# Patient Record
Sex: Female | Born: 1980 | Race: White | Hispanic: No | Marital: Married | Smoking: Never smoker
Health system: Southern US, Community
[De-identification: ages and names within clinical notes are randomized; demographics above are authoritative.]

## PROBLEM LIST (undated history)

## (undated) ENCOUNTER — Inpatient Hospital Stay (HOSPITAL_COMMUNITY): Payer: Self-pay

## (undated) DIAGNOSIS — Z8619 Personal history of other infectious and parasitic diseases: Secondary | ICD-10-CM

## (undated) DIAGNOSIS — R87619 Unspecified abnormal cytological findings in specimens from cervix uteri: Secondary | ICD-10-CM

## (undated) DIAGNOSIS — F32A Depression, unspecified: Secondary | ICD-10-CM

## (undated) DIAGNOSIS — I499 Cardiac arrhythmia, unspecified: Secondary | ICD-10-CM

## (undated) DIAGNOSIS — T7840XA Allergy, unspecified, initial encounter: Secondary | ICD-10-CM

## (undated) DIAGNOSIS — IMO0002 Reserved for concepts with insufficient information to code with codable children: Secondary | ICD-10-CM

## (undated) HISTORY — DX: Cardiac arrhythmia, unspecified: I49.9

## (undated) HISTORY — DX: Personal history of other infectious and parasitic diseases: Z86.19

## (undated) HISTORY — PX: ABDOMINAL HYSTERECTOMY: SHX81

## (undated) HISTORY — DX: Allergy, unspecified, initial encounter: T78.40XA

## (undated) HISTORY — PX: LEEP: SHX91

## (undated) HISTORY — DX: Unspecified abnormal cytological findings in specimens from cervix uteri: R87.619

## (undated) HISTORY — DX: Depression, unspecified: F32.A

## (undated) HISTORY — DX: Reserved for concepts with insufficient information to code with codable children: IMO0002

---

## 2011-07-24 LAB — OB RESULTS CONSOLE GC/CHLAMYDIA
Chlamydia: NEGATIVE
Gonorrhea: NEGATIVE

## 2011-07-24 LAB — OB RESULTS CONSOLE HIV ANTIBODY (ROUTINE TESTING): HIV: NONREACTIVE

## 2011-07-24 LAB — OB RESULTS CONSOLE RUBELLA ANTIBODY, IGM: Rubella: IMMUNE

## 2011-07-24 LAB — OB RESULTS CONSOLE RPR: RPR: NONREACTIVE

## 2012-02-02 ENCOUNTER — Inpatient Hospital Stay (HOSPITAL_COMMUNITY): Admission: AD | Admit: 2012-02-02 | Payer: 59 | Source: Ambulatory Visit | Admitting: Obstetrics and Gynecology

## 2012-03-03 ENCOUNTER — Telehealth (HOSPITAL_COMMUNITY): Payer: Self-pay | Admitting: *Deleted

## 2012-03-03 ENCOUNTER — Encounter (HOSPITAL_COMMUNITY): Payer: Self-pay | Admitting: *Deleted

## 2012-03-03 NOTE — Telephone Encounter (Signed)
Preadmission screen  

## 2012-03-04 ENCOUNTER — Inpatient Hospital Stay (HOSPITAL_COMMUNITY)
Admission: RE | Admit: 2012-03-04 | Discharge: 2012-03-07 | DRG: 775 | Disposition: A | Discharge status: Home / Self Care | Payer: 59 | Source: Ambulatory Visit | Attending: Obstetrics and Gynecology | Admitting: Obstetrics and Gynecology

## 2012-03-04 ENCOUNTER — Encounter (HOSPITAL_COMMUNITY): Payer: Self-pay

## 2012-03-04 DIAGNOSIS — O480 Post-term pregnancy: Principal | ICD-10-CM | POA: Diagnosis present

## 2012-03-04 LAB — CBC
HCT: 35.7 % — ABNORMAL LOW (ref 36.0–46.0)
MCH: 30.3 pg (ref 26.0–34.0)
MCV: 88.6 fL (ref 78.0–100.0)
RBC: 4.03 MIL/uL (ref 3.87–5.11)
WBC: 8.1 10*3/uL (ref 4.0–10.5)

## 2012-03-04 MED ORDER — OXYCODONE-ACETAMINOPHEN 5-325 MG PO TABS: 1.0000 | ORAL_TABLET | ORAL | Status: DC | PRN

## 2012-03-04 MED ORDER — FLEET ENEMA 7-19 GM/118ML RE ENEM: 1.0000 | ENEMA | RECTAL | Status: DC | PRN

## 2012-03-04 MED ORDER — LIDOCAINE HCL (PF) 1 % IJ SOLN
30.0000 mL | INTRAMUSCULAR | Status: DC | PRN
  Filled 2012-03-04: qty 30

## 2012-03-04 MED ORDER — OXYTOCIN BOLUS FROM INFUSION
250.0000 mL | Freq: Once | INTRAVENOUS | Status: AC
  Administered 2012-03-05: 250 mL via INTRAVENOUS
  Filled 2012-03-04: qty 500

## 2012-03-04 MED ORDER — TERBUTALINE SULFATE 1 MG/ML IJ SOLN: 0.2500 mg | Freq: Once | INTRAMUSCULAR | Status: AC | PRN

## 2012-03-04 MED ORDER — CITRIC ACID-SODIUM CITRATE 334-500 MG/5ML PO SOLN: 30.0000 mL | ORAL | Status: DC | PRN

## 2012-03-04 MED ORDER — LACTATED RINGERS IV SOLN
INTRAVENOUS | Status: DC
  Administered 2012-03-04: 20:00:00 via INTRAVENOUS
  Administered 2012-03-05: 125 mL via INTRAVENOUS
  Administered 2012-03-05: 05:00:00 via INTRAVENOUS

## 2012-03-04 MED ORDER — LACTATED RINGERS IV SOLN: 500.0000 mL | INTRAVENOUS | Status: DC | PRN

## 2012-03-04 MED ORDER — MISOPROSTOL 25 MCG QUARTER TABLET
25.0000 ug | ORAL_TABLET | ORAL | Status: DC | PRN
  Administered 2012-03-04: 25 ug via VAGINAL
  Filled 2012-03-04: qty 0.25

## 2012-03-04 MED ORDER — BUTORPHANOL TARTRATE 1 MG/ML IJ SOLN: 1.0000 mg | INTRAMUSCULAR | Status: DC | PRN

## 2012-03-04 MED ORDER — ONDANSETRON HCL 4 MG/2ML IJ SOLN
4.0000 mg | Freq: Four times a day (QID) | INTRAMUSCULAR | Status: DC | PRN
  Administered 2012-03-05: 4 mg via INTRAVENOUS
  Filled 2012-03-04: qty 2

## 2012-03-04 MED ORDER — OXYTOCIN 40 UNITS IN LACTATED RINGERS INFUSION - SIMPLE MED: 62.5000 mL/h | Freq: Once | INTRAVENOUS | Status: DC

## 2012-03-04 MED ORDER — ACETAMINOPHEN 325 MG PO TABS: 650.0000 mg | ORAL_TABLET | ORAL | Status: DC | PRN

## 2012-03-04 NOTE — H&P (Signed)
Caitlin Hunter is a 31 y.o. female presenting for post dates induction of labor. No ROM/CNS change/epigastric pain. Maternal Medical History:  Fetal activity: Perceived fetal activity is normal.      OB History    Grav Para Term Preterm Abortions TAB SAB Ect Mult Living   1              Past Medical History  Diagnosis Date  . H/O varicella   . Dysrhythmia     hx bradycardia   Past Surgical History  Procedure Date  . Leep    Family History: family history includes Cancer in her mother, paternal aunt, and paternal grandfather; Depression in her maternal aunt, maternal grandmother, and maternal uncle; Diabetes in her father; Kidney disease in her father; Mental retardation in her cousin; and Multiple sclerosis in her mother. Social History:  reports that she has never smoked. She has never used smokeless tobacco. She reports that she does not drink alcohol or use illicit drugs.   Prenatal Transfer Tool  Maternal Diabetes: No Genetic Screening: Normal Maternal Ultrasounds/Referrals: Normal Fetal Ultrasounds or other Referrals:  None Maternal Substance Abuse:  No Significant Maternal Medications:  None Significant Maternal Lab Results:  None Other Comments:  None  Review of Systems  Eyes: Negative for blurred vision.  Gastrointestinal: Negative for abdominal pain.  Neurological: Negative for headaches.      Blood pressure 128/76, pulse 57, temperature 98.1 F (36.7 C), temperature source Oral, resp. rate 18, height 5\' 6"  (1.676 m), weight 90.719 kg (200 lb), last menstrual period 05/22/2011. Maternal Exam:  Uterine Assessment: Contraction strength is mild.  Contraction frequency is irregular.   Abdomen: Patient reports no abdominal tenderness. Fetal presentation: vertex     Fetal Exam Fetal Monitor Review: Pattern: accelerations present.       Physical Exam  Cardiovascular: Normal rate and regular rhythm.   Respiratory: Effort normal and breath sounds  normal.  GI: Soft. Bowel sounds are normal.  Neurological: She has normal reflexes.    Prenatal labs: ABO, Rh: A/Positive/-- (01/03 0000) Antibody: Negative (01/03 0000) Rubella: Immune (01/03 0000) RPR: Nonreactive (01/03 0000)  HBsAg: Negative (01/03 0000)  HIV: Non-reactive (01/03 0000)  GBS: Negative (07/12 0000)   Assessment/Plan: 31 yo G1P0 at 41` weeks for two stage induction of labor.  D/W patient and husband induction and risks-fetal distress, emergency cesarean section, tetanic contractions, failed induction. All questions answered.   Lothar Prehn II,Ronnika Collett E 03/04/2012, 8:47 PM

## 2012-03-05 ENCOUNTER — Inpatient Hospital Stay (HOSPITAL_COMMUNITY): Payer: 59 | Admitting: Anesthesiology

## 2012-03-05 ENCOUNTER — Encounter (HOSPITAL_COMMUNITY): Payer: Self-pay

## 2012-03-05 ENCOUNTER — Encounter (HOSPITAL_COMMUNITY): Payer: Self-pay | Admitting: Anesthesiology

## 2012-03-05 MED ORDER — PHENYLEPHRINE 40 MCG/ML (10ML) SYRINGE FOR IV PUSH (FOR BLOOD PRESSURE SUPPORT): 80.0000 ug | PREFILLED_SYRINGE | INTRAVENOUS | Status: DC | PRN

## 2012-03-05 MED ORDER — FENTANYL 2.5 MCG/ML BUPIVACAINE 1/10 % EPIDURAL INFUSION (WH - ANES)
14.0000 mL/h | INTRAMUSCULAR | Status: DC
  Administered 2012-03-05 (×2): 14 mL/h via EPIDURAL
  Filled 2012-03-05 (×3): qty 60

## 2012-03-05 MED ORDER — FENTANYL 2.5 MCG/ML BUPIVACAINE 1/10 % EPIDURAL INFUSION (WH - ANES)
INTRAMUSCULAR | Status: DC | PRN
  Administered 2012-03-05: 14 mL/h via EPIDURAL

## 2012-03-05 MED ORDER — DIPHENHYDRAMINE HCL 50 MG/ML IJ SOLN: 12.5000 mg | INTRAMUSCULAR | Status: DC | PRN

## 2012-03-05 MED ORDER — EPHEDRINE 5 MG/ML INJ: 10.0000 mg | INTRAVENOUS | Status: DC | PRN

## 2012-03-05 MED ORDER — LIDOCAINE HCL (PF) 1 % IJ SOLN
INTRAMUSCULAR | Status: DC | PRN
  Administered 2012-03-05 (×2): 4 mL

## 2012-03-05 MED ORDER — OXYTOCIN 40 UNITS IN LACTATED RINGERS INFUSION - SIMPLE MED
1.0000 m[IU]/min | INTRAVENOUS | Status: DC
  Administered 2012-03-05: 2 m[IU]/min via INTRAVENOUS
  Filled 2012-03-05: qty 1000

## 2012-03-05 MED ORDER — LACTATED RINGERS IV SOLN
500.0000 mL | Freq: Once | INTRAVENOUS | Status: AC
  Administered 2012-03-05: 500 mL via INTRAVENOUS

## 2012-03-05 MED ORDER — PHENYLEPHRINE 40 MCG/ML (10ML) SYRINGE FOR IV PUSH (FOR BLOOD PRESSURE SUPPORT)
80.0000 ug | PREFILLED_SYRINGE | INTRAVENOUS | Status: DC | PRN
  Filled 2012-03-05: qty 5

## 2012-03-05 MED ORDER — TERBUTALINE SULFATE 1 MG/ML IJ SOLN: 0.2500 mg | Freq: Once | INTRAMUSCULAR | Status: AC | PRN

## 2012-03-05 MED ORDER — EPHEDRINE 5 MG/ML INJ
10.0000 mg | INTRAVENOUS | Status: DC | PRN
  Filled 2012-03-05: qty 4

## 2012-03-05 NOTE — Anesthesia Preprocedure Evaluation (Signed)
Anesthesia Evaluation  Patient identified by MRN, date of birth, ID band Patient awake    Reviewed: Allergy & Precautions, H&P , Patient's Chart, lab work & pertinent test results  Airway Mallampati: III TM Distance: >3 FB Neck ROM: full    Dental No notable dental hx. (+) Teeth Intact   Pulmonary neg pulmonary ROS,  breath sounds clear to auscultation  Pulmonary exam normal       Cardiovascular + dysrhythmias Rhythm:regular Rate:Normal     Neuro/Psych negative neurological ROS  negative psych ROS   GI/Hepatic negative GI ROS, Neg liver ROS,   Endo/Other  negative endocrine ROS  Renal/GU negative Renal ROS  negative genitourinary   Musculoskeletal   Abdominal Normal abdominal exam  (+)   Peds  Hematology negative hematology ROS (+)   Anesthesia Other Findings   Reproductive/Obstetrics (+) Pregnancy                           Anesthesia Physical Anesthesia Plan  ASA: II  Anesthesia Plan: Epidural   Post-op Pain Management:    Induction:   Airway Management Planned:   Additional Equipment:   Intra-op Plan:   Post-operative Plan:   Informed Consent: I have reviewed the patients History and Physical, chart, labs and discussed the procedure including the risks, benefits and alternatives for the proposed anesthesia with the patient or authorized representative who has indicated his/her understanding and acceptance.     Plan Discussed with: Anesthesiologist  Anesthesia Plan Comments:         Anesthesia Quick Evaluation

## 2012-03-05 NOTE — Progress Notes (Signed)
Pt complete at 8pm but not feeling pressure.  Plan was to labor down  FHT reassuring Toco Q2 Cvx c/c/+2 per RN exam at 8pm  A/P:  Will start pushing now.

## 2012-03-05 NOTE — Progress Notes (Signed)
Pt comfortable w/ epidural  FHT reassuring w/ accels Toco Q5-6 Cvx 4/90/-1, edematous  A/P:  Discussed pitocin augmentation.  Pt will consider

## 2012-03-05 NOTE — Progress Notes (Signed)
Pt resting comfortably    FHT reassuring Toco Q6-8 Cvx unchanged, 4cm  A/P:  Rec to augment w/ picocin, pt and husband agree IUPC placed

## 2012-03-05 NOTE — Progress Notes (Signed)
Pt getting uncomfortable w/ contractions.  No lof or vb.  S/p cytotec x 1 overnight  FHT reassuring Toco Q8 Cvx 3/90/-2 AROM - scant fluid/bloody show EFW 9#  A/P:  Will recheck in 2 hrs to eval need to give pitocin Exp mngt

## 2012-03-05 NOTE — Anesthesia Procedure Notes (Signed)
Epidural Patient location during procedure: OB Start time: 03/05/2012 1:24 PM  Staffing Anesthesiologist: Staton Markey A. Performed by: anesthesiologist   Preanesthetic Checklist Completed: patient identified, site marked, surgical consent, pre-op evaluation, timeout performed, IV checked, risks and benefits discussed and monitors and equipment checked  Epidural Patient position: sitting Prep: site prepped and draped and DuraPrep Patient monitoring: continuous pulse ox and blood pressure Approach: midline Injection technique: LOR air  Needle:  Needle type: Tuohy  Needle gauge: 17 G Needle length: 9 cm Needle insertion depth: 6 cm Catheter type: closed end flexible Catheter size: 19 Gauge Catheter at skin depth: 11 cm Test dose: negative and Other  Assessment Events: blood not aspirated, injection not painful, no injection resistance, negative IV test and no paresthesia  Additional Notes Patient identified. Risks and benefits discussed including failed block, incomplete  Pain control, post dural puncture headache, nerve damage, paralysis, blood pressure Changes, nausea, vomiting, reactions to medications-both toxic and allergic and post Partum back pain. All questions were answered. Patient expressed understanding and wished to proceed. Sterile technique was used throughout procedure. Epidural site was Dressed with sterile barrier dressing. No paresthesias, signs of intravascular injection Or signs of intrathecal spread were encountered.  Patient was more comfortable after the epidural was dosed. Please see RN's note for documentation of vital signs and FHR which are stable.

## 2012-03-05 NOTE — Progress Notes (Addendum)
SVD of viable female infant w/ apgars of 7,9. PH 7.22 Mild shoulder dystocia resolved <10 sec with suprapubic pressure & Mcroberts Nuchal cord x 1 delivered over body Placenta delivered spontaneous w/ 3VC.   1st degree lac & periurethral lac repaired w/ 3-0 vicryl rapide.  Fundus firm.  EBL 400cc .

## 2012-03-06 ENCOUNTER — Encounter (HOSPITAL_COMMUNITY): Payer: Self-pay

## 2012-03-06 LAB — CBC
HCT: 35.2 % — ABNORMAL LOW (ref 36.0–46.0)
MCH: 30.6 pg (ref 26.0–34.0)
MCHC: 34.4 g/dL (ref 30.0–36.0)
MCV: 88.9 fL (ref 78.0–100.0)
Platelets: 188 10*3/uL (ref 150–400)
RDW: 12.7 % (ref 11.5–15.5)

## 2012-03-06 MED ORDER — MEASLES, MUMPS & RUBELLA VAC ~~LOC~~ INJ
0.5000 mL | INJECTION | Freq: Once | SUBCUTANEOUS | Status: DC
  Filled 2012-03-06: qty 0.5

## 2012-03-06 MED ORDER — WITCH HAZEL-GLYCERIN EX PADS: 1.0000 "application " | MEDICATED_PAD | CUTANEOUS | Status: DC | PRN

## 2012-03-06 MED ORDER — MEDROXYPROGESTERONE ACETATE 150 MG/ML IM SUSP: 150.0000 mg | INTRAMUSCULAR | Status: DC | PRN

## 2012-03-06 MED ORDER — TETANUS-DIPHTH-ACELL PERTUSSIS 5-2.5-18.5 LF-MCG/0.5 IM SUSP: 0.5000 mL | Freq: Once | INTRAMUSCULAR | Status: DC

## 2012-03-06 MED ORDER — PRENATAL MULTIVITAMIN CH
1.0000 | ORAL_TABLET | Freq: Every day | ORAL | Status: DC
  Administered 2012-03-06: 1 via ORAL
  Filled 2012-03-06: qty 1

## 2012-03-06 MED ORDER — ONDANSETRON HCL 4 MG PO TABS: 4.0000 mg | ORAL_TABLET | ORAL | Status: DC | PRN

## 2012-03-06 MED ORDER — IBUPROFEN 600 MG PO TABS
600.0000 mg | ORAL_TABLET | Freq: Four times a day (QID) | ORAL | Status: DC
  Administered 2012-03-06 – 2012-03-07 (×6): 600 mg via ORAL
  Filled 2012-03-06 (×5): qty 1

## 2012-03-06 MED ORDER — SENNOSIDES-DOCUSATE SODIUM 8.6-50 MG PO TABS
2.0000 | ORAL_TABLET | Freq: Every day | ORAL | Status: DC
  Administered 2012-03-06: 2 via ORAL

## 2012-03-06 MED ORDER — DIPHENHYDRAMINE HCL 25 MG PO CAPS: 25.0000 mg | ORAL_CAPSULE | Freq: Four times a day (QID) | ORAL | Status: DC | PRN

## 2012-03-06 MED ORDER — ONDANSETRON HCL 4 MG/2ML IJ SOLN: 4.0000 mg | INTRAMUSCULAR | Status: DC | PRN

## 2012-03-06 MED ORDER — LANOLIN HYDROUS EX OINT: TOPICAL_OINTMENT | CUTANEOUS | Status: DC | PRN

## 2012-03-06 MED ORDER — DIBUCAINE 1 % RE OINT: 1.0000 "application " | TOPICAL_OINTMENT | RECTAL | Status: DC | PRN

## 2012-03-06 MED ORDER — BENZOCAINE-MENTHOL 20-0.5 % EX AERO
1.0000 "application " | INHALATION_SPRAY | CUTANEOUS | Status: DC | PRN
  Filled 2012-03-06: qty 56

## 2012-03-06 MED ORDER — OXYCODONE-ACETAMINOPHEN 5-325 MG PO TABS: 1.0000 | ORAL_TABLET | ORAL | Status: DC | PRN

## 2012-03-06 MED ORDER — SIMETHICONE 80 MG PO CHEW: 80.0000 mg | CHEWABLE_TABLET | ORAL | Status: DC | PRN

## 2012-03-06 NOTE — Anesthesia Postprocedure Evaluation (Signed)
  Anesthesia Post-op Note  Patient: Caitlin Hunter  Procedure(s) Performed: * No procedures listed *  Patient Location: Mother/Baby  Anesthesia Type: Epidural  Level of Consciousness: awake, alert  and oriented  Airway and Oxygen Therapy: Patient Spontanous Breathing  Post-op Pain: mild  Post-op Assessment: Patient's Cardiovascular Status Stable, Respiratory Function Stable, Patent Airway, No signs of Nausea or vomiting and Pain level controlled  Post-op Vital Signs: stable  Complications: No apparent anesthesia complications

## 2012-03-06 NOTE — Progress Notes (Signed)
Post Partum Day 1 Subjective: no complaints, up ad lib and tolerating PO  Objective: Blood pressure 96/58, pulse 58, temperature 98.1 F (36.7 C), temperature source Oral, resp. rate 18, height 5\' 6"  (1.676 m), weight 90.719 kg (200 lb), last menstrual period 05/22/2011, SpO2 96.00%, unknown if currently breastfeeding.  Physical Exam:  General: alert and cooperative Lochia: appropriate Uterine Fundus: firm Incision: n/a DVT Evaluation: No evidence of DVT seen on physical exam.   Basename 03/06/12 0615 03/04/12 2010  HGB 12.1 12.2  HCT 35.2* 35.7*    Assessment/Plan: Plan for discharge tomorrow   LOS: 2 days   Tyneisha Hegeman 03/06/2012, 8:20 AM

## 2012-03-07 NOTE — Discharge Summary (Signed)
Obstetric Discharge Summary Reason for Admission: induction of labor Prenatal Procedures: ultrasound Intrapartum Procedures: spontaneous vaginal delivery Postpartum Procedures: none Complications-Operative and Postpartum: none Hemoglobin  Date Value Range Status  03/06/2012 12.1  12.0 - 15.0 g/dL Final     HCT  Date Value Range Status  03/06/2012 35.2* 36.0 - 46.0 % Final    Physical Exam:  General: alert and cooperative Lochia: appropriate Uterine Fundus: firm Incision: n/a DVT Evaluation: No evidence of DVT seen on physical exam.  Discharge Diagnoses: Term Pregnancy-delivered  Discharge Information: Date: 03/07/2012 Activity: pelvic rest Diet: routine Medications: PNV and Ibuprofen Condition: stable Instructions: refer to practice specific booklet Discharge to: home Follow-up Information    Schedule an appointment as soon as possible for a visit in 6 weeks to follow up.         Newborn Data: Live born female  Birth Weight: 9 lb 2.9 oz (4165 g) APGAR: 7, 9  Home with mother.  Kerim Statzer 03/07/2012, 8:05 AM

## 2012-03-11 ENCOUNTER — Ambulatory Visit (HOSPITAL_COMMUNITY)
Admission: RE | Admit: 2012-03-11 | Discharge: 2012-03-11 | Disposition: A | Discharge status: Home / Self Care | Payer: 59 | Source: Ambulatory Visit | Attending: Obstetrics and Gynecology | Admitting: Obstetrics and Gynecology

## 2012-12-21 LAB — OB RESULTS CONSOLE HEPATITIS B SURFACE ANTIGEN: Hepatitis B Surface Ag: NEGATIVE

## 2012-12-21 LAB — OB RESULTS CONSOLE GC/CHLAMYDIA
CHLAMYDIA, DNA PROBE: NEGATIVE
Gonorrhea: NEGATIVE

## 2012-12-21 LAB — OB RESULTS CONSOLE ANTIBODY SCREEN: ANTIBODY SCREEN: NEGATIVE

## 2012-12-21 LAB — OB RESULTS CONSOLE HIV ANTIBODY (ROUTINE TESTING): HIV: NONREACTIVE

## 2012-12-21 LAB — OB RESULTS CONSOLE ABO/RH: RH Type: POSITIVE

## 2012-12-21 LAB — OB RESULTS CONSOLE RPR: RPR: NONREACTIVE

## 2012-12-21 LAB — OB RESULTS CONSOLE RUBELLA ANTIBODY, IGM: Rubella: IMMUNE

## 2013-06-29 LAB — OB RESULTS CONSOLE GBS: STREP GROUP B AG: NEGATIVE

## 2013-07-21 NOTE — L&D Delivery Note (Signed)
Delivery Note At 12:44 PM a viable female was delivered via Vaginal, Spontaneous Delivery (Presentation: ; Occiput Anterior).  APGAR: , ; weight .   Placenta status: Intact, Spontaneous.  Cord: 3 vessels with the following complications: None.  Cord pH: not sent  Anesthesia: Epidural  Episiotomy: none Lacerations: lL periurethral Suture Repair: 3.0 vicryl rapide Est. Blood Loss (mL): 300  Mom to postpartum.  Baby to Nursery.  [name],Caitlin Hunter 08/01/2013, 12:56 PM

## 2013-07-22 ENCOUNTER — Encounter (HOSPITAL_COMMUNITY): Payer: Self-pay | Admitting: *Deleted

## 2013-07-22 ENCOUNTER — Inpatient Hospital Stay (HOSPITAL_COMMUNITY)
Admission: AD | Admit: 2013-07-22 | Discharge: 2013-07-22 | Disposition: A | Discharge status: Home / Self Care | Payer: 59 | Source: Ambulatory Visit | Attending: Obstetrics & Gynecology | Admitting: Obstetrics & Gynecology

## 2013-07-22 DIAGNOSIS — R1032 Left lower quadrant pain: Secondary | ICD-10-CM | POA: Insufficient documentation

## 2013-07-22 DIAGNOSIS — O479 False labor, unspecified: Secondary | ICD-10-CM | POA: Insufficient documentation

## 2013-07-22 NOTE — MAU Note (Signed)
Started having pain in LLQ, last for 1.5 hrs. Started to go away, but now is having contractions and back pan.

## 2013-07-22 NOTE — Discharge Instructions (Signed)

## 2013-07-28 ENCOUNTER — Telehealth (HOSPITAL_COMMUNITY): Payer: Self-pay | Admitting: *Deleted

## 2013-07-28 ENCOUNTER — Encounter (HOSPITAL_COMMUNITY): Payer: Self-pay | Admitting: *Deleted

## 2013-07-28 ENCOUNTER — Inpatient Hospital Stay (HOSPITAL_COMMUNITY)
Admission: AD | Admit: 2013-07-28 | Discharge: 2013-07-29 | Disposition: A | Discharge status: Home / Self Care | Payer: 59 | Source: Ambulatory Visit | Attending: Obstetrics and Gynecology | Admitting: Obstetrics and Gynecology

## 2013-07-28 DIAGNOSIS — O479 False labor, unspecified: Secondary | ICD-10-CM | POA: Insufficient documentation

## 2013-07-28 DIAGNOSIS — O4693 Antepartum hemorrhage, unspecified, third trimester: Secondary | ICD-10-CM

## 2013-07-28 DIAGNOSIS — O4690 Antepartum hemorrhage, unspecified, unspecified trimester: Secondary | ICD-10-CM | POA: Insufficient documentation

## 2013-07-28 LAB — CBC
HCT: 34.2 % — ABNORMAL LOW (ref 36.0–46.0)
Hemoglobin: 12 g/dL (ref 12.0–15.0)
MCH: 30.2 pg (ref 26.0–34.0)
MCHC: 35.1 g/dL (ref 30.0–36.0)
MCV: 86.1 fL (ref 78.0–100.0)
PLATELETS: 193 10*3/uL (ref 150–400)
RBC: 3.97 MIL/uL (ref 3.87–5.11)
RDW: 12.5 % (ref 11.5–15.5)
WBC: 9 10*3/uL (ref 4.0–10.5)

## 2013-07-28 MED ORDER — SODIUM CHLORIDE 0.9 % IV SOLN: INTRAVENOUS | Status: DC

## 2013-07-28 NOTE — MAU Note (Signed)
Pt reports that she had some leaking and thought that her water had broken. When she got out of bed she discovered that she was bleeding. Pt also reports having some clots.

## 2013-07-28 NOTE — Telephone Encounter (Signed)
Preadmission screen  

## 2013-07-28 NOTE — MAU Provider Note (Signed)
Chief Complaint:  Vaginal Bleeding   First Provider Initiated Contact with Patient 07/28/13 2335     HPI: Caitlin Hunter is a 33 y.o. G2P1001 at [redacted]w[redacted]d who presents to maternity admissions reporting waking up with heavy, bright red bleeding and passing moderate-sized clots. Reports that blood was running down her legs into her shoes. Mild contractions w/out pain in between. No prior Hx of bleeding this pregnancy.   Denies leakage of fluid. Good fetal movement.   Pregnancy Course: Low-lying placenta per 18 week anatomy scan, but resolved per office [place].  Past Medical History: Past Medical History  Diagnosis Date  . H/O varicella   . Dysrhythmia     hx bradycardia  . Abnormal Pap smear     Past obstetric history: OB History  Gravida Para Term Preterm AB SAB TAB Ectopic Multiple Living  2 1 1  0 0 0 0 0 0 1    # Outcome Date GA Lbr Len/2nd Weight Sex Delivery Anes PTL Lv  2 CUR           1 TRM 03/05/12 [redacted]w[redacted]d 08:30 / 02:45 4.165 kg (9 lb 2.9 oz) F SVD EPI  Y      Past Surgical History: Past Surgical History  Procedure Laterality Date  . Leep       Family History: Family History  Problem Relation Age of Onset  . Multiple sclerosis Mother   . Cancer Mother     ovarian  . Diabetes Father   . Kidney disease Father   . Depression Maternal Aunt   . Depression Maternal Uncle   . Cancer Paternal Aunt     lymphoma  . Depression Maternal Grandmother   . Cancer Paternal Grandfather     kidney  . Mental retardation Cousin     2nd cousin trisomy 26    Social History: History  Substance Use Topics  . Smoking status: Never Smoker   . Smokeless tobacco: Never Used  . Alcohol Use: No    Allergies:  Allergies  Allergen Reactions  . Aspirin Swelling and Rash    Meds:  Prescriptions prior to admission  Medication Sig Dispense Refill  . Prenatal Vit-Fe Fumarate-FA (PRENATAL MULTIVITAMIN) TABS Take 1 tablet by mouth daily.        ROS: Pertinent findings in history of  present illness. Neg for dizziness, syncope, HA, vision changes, epigastric pain.   Physical Exam  Blood pressure 106/63, pulse 63, temperature 98.4 F (36.9 C), temperature source Oral, resp. rate 16, height 5\' 6"  (1.676 m), weight 90.719 kg (200 lb), last menstrual period 10/23/2012. GENERAL: Well-developed, well-nourished female in no acute distress.  HEENT: normocephalic HEART: normal rate RESP: normal effort ABDOMEN: Soft, non-tender, gravid appropriate for gestational age EXTREMITIES: Nontender, no edema NEURO: alert and oriented SPECULUM EXAM: NEFG, moderate amount of bright red blood in vault. Neg pool. Cervix clean Dilation: 1.5 Effacement (%): Thick Station: -3 Exam by:: Ivonne Andrew CNM Dilation: 1.5 Effacement (%): Thick Station: -3 Exam by:: Ivonne Andrew CNM  FHT:  Baseline 130 , moderate variability, accelerations present, no decelerations Contractions: irreg, mild   Labs: Results for orders placed during the hospital encounter of 07/28/13 (from the past 24 hour(s))  TYPE AND SCREEN     Status: None   Collection Time    07/28/13 11:25 PM      Result Value Range   ABO/RH(D) A POS     Antibody Screen NEG     Sample Expiration 07/31/2013    CBC  Status: Abnormal   Collection Time    07/28/13 11:25 PM      Result Value Range   WBC 9.0  4.0 - 10.5 K/uL   RBC 3.97  3.87 - 5.11 MIL/uL   Hemoglobin 12.0  12.0 - 15.0 g/dL   HCT 16.134.2 (*) 09.636.0 - 04.546.0 %   MCV 86.1  78.0 - 100.0 fL   MCH 30.2  26.0 - 34.0 pg   MCHC 35.1  30.0 - 36.0 g/dL   RDW 40.912.5  81.111.5 - 91.415.5 %   Platelets 193  150 - 400 K/uL  ABO/RH     Status: None   Collection Time    07/28/13 11:25 PM      Result Value Range   ABO/RH(D) A POS     Imaging:  OB Limited: Cephalic. Posterior placenta. No evidence of abruption, but portions of placenta obscured by fetal parts   MAU Course: Small -moderate of bleeding during MAU visit.   Dr. Rana SnareLowe notified of bleeding episode, US, decreased bleeding. Obs on  Antenatal.   Assessment: 1. Vaginal bleeding in pregnancy, third trimester-concern for abruption.    Plan: Obs on Antenatal.  [name], CNM 07/29/2013 4:18 AM

## 2013-07-29 ENCOUNTER — Encounter (HOSPITAL_COMMUNITY): Payer: Self-pay

## 2013-07-29 ENCOUNTER — Inpatient Hospital Stay (HOSPITAL_COMMUNITY): Payer: 59

## 2013-07-29 DIAGNOSIS — O4690 Antepartum hemorrhage, unspecified, unspecified trimester: Secondary | ICD-10-CM

## 2013-07-29 LAB — TYPE AND SCREEN
ABO/RH(D): A POS
ANTIBODY SCREEN: NEGATIVE

## 2013-07-29 LAB — CBC
HCT: 31.9 % — ABNORMAL LOW (ref 36.0–46.0)
Hemoglobin: 10.9 g/dL — ABNORMAL LOW (ref 12.0–15.0)
MCH: 29.8 pg (ref 26.0–34.0)
MCHC: 34.2 g/dL (ref 30.0–36.0)
MCV: 87.2 fL (ref 78.0–100.0)
Platelets: 170 10*3/uL (ref 150–400)
RBC: 3.66 MIL/uL — AB (ref 3.87–5.11)
RDW: 12.6 % (ref 11.5–15.5)
WBC: 8.5 10*3/uL (ref 4.0–10.5)

## 2013-07-29 LAB — ABO/RH: ABO/RH(D): A POS

## 2013-07-29 MED ORDER — LACTATED RINGERS IV SOLN
INTRAVENOUS | Status: DC
  Administered 2013-07-28: 23:00:00 via INTRAVENOUS

## 2013-07-29 MED ORDER — LACTATED RINGERS IV SOLN
INTRAVENOUS | Status: DC
  Administered 2013-07-29: 05:00:00 via INTRAVENOUS

## 2013-07-29 NOTE — Discharge Summary (Signed)
Physician Discharge Summary  Patient ID: Elon SpannerLauren Hodge-Frace MRN: [id number] DOB/AGE: Dec 26, 1980 33 y.o.  Admit date: 07/28/2013 Discharge date: 07/29/2013  Admission Diagnoses: vaginal bleeding, pregnancy  Discharge Diagnoses:  Active Problems:   * No active hospital problems. *   Discharged Condition: stable  Hospital Course: Pt was admitted for overnight observation after an episode of vaginal bleeding.  Pt had digital cervical exam Wednesday in office, 2cm.  She was admitted to hospital overnight for observation after episode of vaginal bleeding.  Overnight, no further vb was noted.  Ctx are occasional but no cervical change noted.  Plan for d/c home with f/u as scheduled for indxn.  Consults: None  Significant Diagnostic Studies:    Treatments: IV hydration  Discharge Exam: Blood pressure 107/65, pulse 69, temperature 98.2 F (36.8 C), temperature source Oral, resp. rate 20, height 5\' 6"  (1.676 m), weight 90.719 kg (200 lb), last menstrual period 10/23/2012. Gen - NAD Abd - gravid, NT Cvx - unchanged, no blood on glove  Disposition: 01-Home or Self Care   DAVS: Troubleshooting Information   AVS troubleshooting information Refreshed 2013-07-29 08:43:06 EST   Data Value   Patient ID, internal format "  [id number]"   Patient DAT of contact being viewed [phone]   Patient CSN of contact being viewed [id number]   Mode OP_MEDS   Discharge IEV record [id number]       DAVS: Context IP   Data Value     Basic   LPG ID CHL IP DISCHARGE AVS Marylu LundSMARTLINK [[id number]][[phone]]   pData [phone]^Eprivate([id number])   Calling context ""   Filter LPP "" [[id number]][[phone]]   Filter code $$FilterPP^LMIUTIL1(ordId,"","1,2,3,4,5,6,7,8,9,10,11,12,13,14,1000,1001,1002,1003,1004,1005,1006,1008,1009,1010,1011,1012,1013,1014,1015")     Inpatient   IsAdmission Yes [1]   Discharge IEV [id number]   Current admission DAT [phone] [id number]     Emergency Department   IsED (contact) No [0]   IsCurED (patient) No [0]   PatientHasArrivedInED (only applies to ED contacts) Yes [1]     Hospital Outpatient   IsHOV No [0]   Appt status ""   ADT status Admitted   HOV as IP or AMB Treat as inpatient     Ambulatory   Is Ambulatory Contact No [0]   Is Encounter Closed No [0]       DAVS: Snapshot Live   Data Value   Showing a snapshot? No [0]   Snapshot strategy As of now [1]   IEV record [id number]   Most recent snapshot line for patient (in "OP_MEDS" mode) ""   Absolute last snapshot line (in "OP_MEDS" mode) ""       DAVS: Generating the med list   ID Order Details   [id number] Prenatal Vit-Fe Fumarate-FA (PRENATAL MULTIVITAMIN) TABS Found by the discharge search as a PTA order.       DAVS: Med list   Name In ORDERS node? In PTA_MEDS node?   Prenatal Vit-Fe Fumarate-FA (PRENATAL MULTIVITAMIN) TABS YES YES   (1 orders in ORDERS; 1 orders in PTA_MEDS)       DAVS: Pairing PTA orders with non-PTA orders (show by pair)   Name Name   Prenatal Vit-Fe Fumarate-FA (PRENATAL MULTIVITAMIN) TABS        DAVS: Pairing PTA orders with non-PTA orders (show by order)   Name In pair? PTA pairs Take Home pairs   Prenatal Vit-Fe Fumarate-FA (PRENATAL MULTIVITAMIN) TABS Yes 1    (1 orders)       DAVS: Calculating dispositions (first pass)   Action Details Action Details Disposition   Resume at Discharge [  152] lpp-allow      Key   Mnemonic Meaning   comb-from a STOP order that was combined with a START order   comb-to a START order that was combined with a STOP order   conc-enc from an encounter concurrent to this one   disc discontinued   dup-stop filtered out because it was a STOP order that duplicated another order   exp expired   fut-disc discontinued with a future discontinue date   lpp-allow the filter LPP decided not to hide this order   lpp-hide the filter LPP decided to hide this order   lpp-skip the order was not evaluated by the filter LPP because the order is part of a modification   LT  medication is marked as long-term   no-rev-req no review required   quest unclear whether the order should be on the PTA list   unrev not reviewed for discharge           DAVS: Equivalency group adjustments   Group: Prenatal Vit-Fe Vernie Ammons ([id number])   Order Old disposition New disposition Other Order   [id number] CONTINUE --Unchanged--            DAVS: Calculating dispositions (second pass)   Action Details Action Details Disposition   Resume at Discharge [152] lpp-allow      Key   Mnemonic Meaning   comb-from a STOP order that was combined with a START order   comb-to a START order that was combined with a STOP order   conc-enc from an encounter concurrent to this one   disc discontinued   dup-stop filtered out because it was a STOP order that duplicated another order   exp expired   fut-disc discontinued with a future discontinue date   lpp-allow the filter LPP decided not to hide this order   lpp-hide the filter LPP decided to hide this order   lpp-skip the order was not evaluated by the filter LPP because the order is part of a modification   LT medication is marked as long-term   no-rev-req no review required   quest unclear whether the order should be on the PTA list   unrev not reviewed for discharge              Medication List         prenatal multivitamin Tabs tablet  Take 1 tablet by mouth daily.  Order ID: [id number]           Follow-up Information   Follow up In 3 days. (as scheduled)       Signed: Kenn Rekowski 07/29/2013, 8:43 AM

## 2013-07-29 NOTE — Progress Notes (Signed)
Pt denies regular ctx.  No further vb.  No lof.  Good FM  AF, VSS + FHT, reassuring Toco - occ  Abd - gravid, NT Cvx - unchanged  A/P:  Discharge home F/u for IOL 3 days as scheduled

## 2013-07-29 NOTE — Progress Notes (Signed)
Discharge instructions given, pt verbalizes and understands, pt dc'd home 

## 2013-08-01 ENCOUNTER — Inpatient Hospital Stay (HOSPITAL_COMMUNITY)
Admission: RE | Admit: 2013-08-01 | Discharge: 2013-08-02 | DRG: 775 | Disposition: A | Discharge status: Home / Self Care | Payer: 59 | Source: Ambulatory Visit | Attending: Obstetrics and Gynecology | Admitting: Obstetrics and Gynecology

## 2013-08-01 ENCOUNTER — Inpatient Hospital Stay (HOSPITAL_COMMUNITY): Payer: 59 | Admitting: Anesthesiology

## 2013-08-01 ENCOUNTER — Encounter (HOSPITAL_COMMUNITY): Payer: 59 | Admitting: Anesthesiology

## 2013-08-01 ENCOUNTER — Encounter (HOSPITAL_COMMUNITY): Payer: Self-pay

## 2013-08-01 DIAGNOSIS — O4690 Antepartum hemorrhage, unspecified, unspecified trimester: Secondary | ICD-10-CM | POA: Diagnosis present

## 2013-08-01 DIAGNOSIS — Z3490 Encounter for supervision of normal pregnancy, unspecified, unspecified trimester: Secondary | ICD-10-CM

## 2013-08-01 LAB — RPR: RPR: NONREACTIVE

## 2013-08-01 LAB — CBC
HCT: 35.3 % — ABNORMAL LOW (ref 36.0–46.0)
Hemoglobin: 12 g/dL (ref 12.0–15.0)
MCH: 29.9 pg (ref 26.0–34.0)
MCHC: 34 g/dL (ref 30.0–36.0)
MCV: 88 fL (ref 78.0–100.0)
Platelets: 209 10*3/uL (ref 150–400)
RBC: 4.01 MIL/uL (ref 3.87–5.11)
RDW: 12.6 % (ref 11.5–15.5)
WBC: 7.7 10*3/uL (ref 4.0–10.5)

## 2013-08-01 MED ORDER — LIDOCAINE HCL (PF) 1 % IJ SOLN: 30.0000 mL | INTRAMUSCULAR | Status: DC | PRN

## 2013-08-01 MED ORDER — OXYCODONE-ACETAMINOPHEN 5-325 MG PO TABS: 1.0000 | ORAL_TABLET | ORAL | Status: DC | PRN

## 2013-08-01 MED ORDER — OXYTOCIN 40 UNITS IN LACTATED RINGERS INFUSION - SIMPLE MED
62.5000 mL/h | INTRAVENOUS | Status: DC
  Administered 2013-08-01: 999 mL/h via INTRAVENOUS

## 2013-08-01 MED ORDER — TETANUS-DIPHTH-ACELL PERTUSSIS 5-2.5-18.5 LF-MCG/0.5 IM SUSP
0.5000 mL | Freq: Once | INTRAMUSCULAR | Status: DC
  Filled 2013-08-01: qty 0.5

## 2013-08-01 MED ORDER — ZOLPIDEM TARTRATE 5 MG PO TABS: 5.0000 mg | ORAL_TABLET | Freq: Every evening | ORAL | Status: DC | PRN

## 2013-08-01 MED ORDER — SENNOSIDES-DOCUSATE SODIUM 8.6-50 MG PO TABS
2.0000 | ORAL_TABLET | ORAL | Status: DC
  Administered 2013-08-01: 2 via ORAL
  Filled 2013-08-01: qty 2

## 2013-08-01 MED ORDER — FLEET ENEMA 7-19 GM/118ML RE ENEM: 1.0000 | ENEMA | RECTAL | Status: DC | PRN

## 2013-08-01 MED ORDER — PRENATAL MULTIVITAMIN CH: 1.0000 | ORAL_TABLET | Freq: Every day | ORAL | Status: DC

## 2013-08-01 MED ORDER — IBUPROFEN 800 MG PO TABS
800.0000 mg | ORAL_TABLET | Freq: Three times a day (TID) | ORAL | Status: DC | PRN
  Administered 2013-08-01: 800 mg via ORAL
  Filled 2013-08-01: qty 1

## 2013-08-01 MED ORDER — LACTATED RINGERS IV SOLN
500.0000 mL | INTRAVENOUS | Status: DC | PRN
  Administered 2013-08-01: 1000 mL via INTRAVENOUS

## 2013-08-01 MED ORDER — MEASLES, MUMPS & RUBELLA VAC ~~LOC~~ INJ
0.5000 mL | INJECTION | Freq: Once | SUBCUTANEOUS | Status: DC
  Filled 2013-08-01: qty 0.5

## 2013-08-01 MED ORDER — LACTATED RINGERS IV SOLN: INTRAVENOUS | Status: DC

## 2013-08-01 MED ORDER — DIBUCAINE 1 % RE OINT
1.0000 "application " | TOPICAL_OINTMENT | RECTAL | Status: DC | PRN
  Filled 2013-08-01: qty 28

## 2013-08-01 MED ORDER — DIPHENHYDRAMINE HCL 25 MG PO CAPS
25.0000 mg | ORAL_CAPSULE | Freq: Four times a day (QID) | ORAL | Status: DC | PRN
Start: 2013-08-01 — End: 2013-08-02

## 2013-08-01 MED ORDER — TERBUTALINE SULFATE 1 MG/ML IJ SOLN: 0.2500 mg | Freq: Once | INTRAMUSCULAR | Status: DC | PRN

## 2013-08-01 MED ORDER — BENZOCAINE-MENTHOL 20-0.5 % EX AERO
1.0000 | INHALATION_SPRAY | CUTANEOUS | Status: DC | PRN
Start: 2013-08-01 — End: 2013-08-02
  Administered 2013-08-01: 1 via TOPICAL
  Filled 2013-08-01 (×2): qty 56

## 2013-08-01 MED ORDER — FLEET ENEMA 7-19 GM/118ML RE ENEM: 1.0000 | ENEMA | Freq: Every day | RECTAL | Status: DC | PRN

## 2013-08-01 MED ORDER — ACETAMINOPHEN 325 MG PO TABS: 650.0000 mg | ORAL_TABLET | ORAL | Status: DC | PRN

## 2013-08-01 MED ORDER — OXYTOCIN 40 UNITS IN LACTATED RINGERS INFUSION - SIMPLE MED
1.0000 m[IU]/min | INTRAVENOUS | Status: DC
  Administered 2013-08-01: 2 m[IU]/min via INTRAVENOUS
  Filled 2013-08-01: qty 1000

## 2013-08-01 MED ORDER — ONDANSETRON HCL 4 MG/2ML IJ SOLN: 4.0000 mg | INTRAMUSCULAR | Status: DC | PRN

## 2013-08-01 MED ORDER — OXYTOCIN BOLUS FROM INFUSION: 500.0000 mL | INTRAVENOUS | Status: DC

## 2013-08-01 MED ORDER — EPHEDRINE 5 MG/ML INJ
INTRAVENOUS | Status: AC
  Filled 2013-08-01: qty 4

## 2013-08-01 MED ORDER — SIMETHICONE 80 MG PO CHEW: 80.0000 mg | CHEWABLE_TABLET | ORAL | Status: DC | PRN

## 2013-08-01 MED ORDER — PHENYLEPHRINE 40 MCG/ML (10ML) SYRINGE FOR IV PUSH (FOR BLOOD PRESSURE SUPPORT)
PREFILLED_SYRINGE | INTRAVENOUS | Status: AC
  Filled 2013-08-01: qty 5

## 2013-08-01 MED ORDER — IBUPROFEN 600 MG PO TABS: 600.0000 mg | ORAL_TABLET | Freq: Four times a day (QID) | ORAL | Status: DC | PRN

## 2013-08-01 MED ORDER — CITRIC ACID-SODIUM CITRATE 334-500 MG/5ML PO SOLN: 30.0000 mL | ORAL | Status: DC | PRN

## 2013-08-01 MED ORDER — ONDANSETRON HCL 4 MG/2ML IJ SOLN
4.0000 mg | Freq: Four times a day (QID) | INTRAMUSCULAR | Status: DC | PRN
Start: 2013-08-01 — End: 2013-08-01

## 2013-08-01 MED ORDER — WITCH HAZEL-GLYCERIN EX PADS: 1.0000 "application " | MEDICATED_PAD | CUTANEOUS | Status: DC | PRN

## 2013-08-01 MED ORDER — BISACODYL 10 MG RE SUPP
10.0000 mg | Freq: Every day | RECTAL | Status: DC | PRN
  Filled 2013-08-01: qty 1

## 2013-08-01 MED ORDER — LIDOCAINE HCL (PF) 1 % IJ SOLN
INTRAMUSCULAR | Status: DC | PRN
  Administered 2013-08-01 (×2): 5 mL

## 2013-08-01 MED ORDER — OXYCODONE-ACETAMINOPHEN 5-325 MG PO TABS: 1.0000 | ORAL_TABLET | Freq: Four times a day (QID) | ORAL | Status: DC | PRN

## 2013-08-01 MED ORDER — LANOLIN HYDROUS EX OINT: TOPICAL_OINTMENT | CUTANEOUS | Status: DC | PRN

## 2013-08-01 MED ORDER — ONDANSETRON HCL 4 MG PO TABS: 4.0000 mg | ORAL_TABLET | ORAL | Status: DC | PRN

## 2013-08-01 MED ORDER — FENTANYL 2.5 MCG/ML BUPIVACAINE 1/10 % EPIDURAL INFUSION (WH - ANES)
INTRAMUSCULAR | Status: AC
  Administered 2013-08-01: 14 mL/h via EPIDURAL
  Filled 2013-08-01: qty 125

## 2013-08-01 NOTE — Anesthesia Procedure Notes (Signed)
Epidural Patient location during procedure: OB Start time: 08/01/2013 10:47 AM  Staffing Anesthesiologist: Brayton CavesJACKSON, Mykeisha Dysert Performed by: anesthesiologist   Preanesthetic Checklist Completed: patient identified, site marked, surgical consent, pre-op evaluation, timeout performed, IV checked, risks and benefits discussed and monitors and equipment checked  Epidural Patient position: sitting Prep: site prepped and draped and DuraPrep Patient monitoring: continuous pulse ox and blood pressure Approach: midline Injection technique: LOR air  Needle:  Needle type: Tuohy  Needle gauge: 17 G Needle length: 9 cm and 9 Needle insertion depth: 5 cm cm Catheter type: closed end flexible Catheter size: 19 Gauge Catheter at skin depth: 10 cm Test dose: negative  Assessment Events: blood not aspirated, injection not painful, no injection resistance, negative IV test and no paresthesia  Additional Notes Patient identified.  Risk benefits discussed including failed block, incomplete pain control, headache, nerve damage, paralysis, blood pressure changes, nausea, vomiting, reactions to medication both toxic or allergic, and postpartum back pain.  Patient expressed understanding and wished to proceed.  All questions were answered.  Sterile technique used throughout procedure and epidural site dressed with sterile barrier dressing. No paresthesia or other complications noted.The patient did not experience any signs of intravascular injection such as tinnitus or metallic taste in mouth nor signs of intrathecal spread such as rapid motor block. Please see nursing notes for vital signs.

## 2013-08-01 NOTE — H&P (Signed)
Caitlin Hunter is a 33 y.o. female presenting for AROM IOL. Maternal Medical History:  Fetal activity: Perceived fetal activity is normal.    Prenatal complications: Bleeding.     OB History   Grav Para Term Preterm Abortions TAB SAB Ect Mult Living   2 1 1  0 0 0 0 0 0 1     Past Medical History  Diagnosis Date  . H/O varicella   . Dysrhythmia     hx bradycardia  . Abnormal Pap smear    Past Surgical History  Procedure Laterality Date  . Leep     Family History: family history includes Cancer in her mother, paternal aunt, and paternal grandfather; Depression in her maternal aunt, maternal grandmother, and maternal uncle; Diabetes in her father; Kidney disease in her father; Mental retardation in her cousin; Multiple sclerosis in her mother. Social History:  reports that she has never smoked. She has never used smokeless tobacco. She reports that she does not drink alcohol or use illicit drugs.   Prenatal Transfer Tool  Maternal Diabetes: No Genetic Screening: Normal Maternal Ultrasounds/Referrals: Normal Fetal Ultrasounds or other Referrals:  None Maternal Substance Abuse:  No Significant Maternal Medications:  None Significant Maternal Lab Results:  None Other Comments:  None  ROS  Dilation: 3 Effacement (%): 70 Station: -3 Exam by:: Dr Marcelle OverlieHolland Blood pressure 111/64, pulse 74, temperature 98 F (36.7 C), temperature source Oral, resp. rate 18, last menstrual period 10/23/2012. Maternal Exam:  Uterine Assessment: Contraction strength is mild.  Abdomen: Patient reports no abdominal tenderness. Fundal height is term FH/FHR 148.   Estimated fetal weight is AGA.   Fetal presentation: vertex  Introitus: Normal vulva. Normal vagina.    Physical Exam  Constitutional: She is oriented to person, place, and time. She appears well-developed and well-nourished.  HENT:  Head: Normocephalic and atraumatic.  Neck: Normal range of motion. Neck supple.  Cardiovascular:  Normal rate and regular rhythm.   Respiratory: Effort normal and breath sounds normal.  GI:  Term FH/FHR 146  Genitourinary:  3/80/post/vtx  Musculoskeletal: Normal range of motion.  Neurological: She is alert and oriented to person, place, and time.    Prenatal labs: ABO, Rh: --/--/A POS, A POS (01/08 2325) Antibody: NEG (01/08 2325) Rubella: Immune (06/03 0000) RPR: Nonreactive (06/03 0000)  HBsAg: Negative (06/03 0000)  HIV: Non-reactive (06/03 0000)  GBS: Negative (12/10 0000)   Assessment/Plan: Term IUP for AROM   Caitlin Hunter M 08/01/2013, 7:53 AM

## 2013-08-01 NOTE — Anesthesia Preprocedure Evaluation (Signed)

## 2013-08-01 NOTE — Lactation Note (Signed)
This note was copied from the chart of Caitlin Hunter. Lactation Consultation Note Initial visit at 10 hours of age.  Mom reports having milk supply problems with her now 4517 month old.  She continued for about 6 months, but didn't have much milk despite pumping and supplements to increase milk supply.  Assessment reveals mildly wide spaced breast in a somewhat tubular shape.  Mom reports no changes during pregnancy with either one.  Hand expression reveals easily expressed colostrum in large supply.  Baby latches well with no assistance.  Discussed need to follow baby closely for weight and output.  [facility] resources discussed including support group and OP services.  Encouraged mom to schedule out patient appointment. Prior to discharge.  Mulitiple feeding, voids and stools at 10 hours of age.  Discussed skin to skin and feeding with early cues.  Mom to call for assist as needed.   Patient Name: Caitlin Leotis ShamesLauren Hunter Today's Date: 08/01/2013 Reason for consult: Initial assessment   Maternal Data Formula Feeding for Exclusion: No Has patient been taught Hand Expression?: Yes Does the patient have breastfeeding experience prior to this delivery?: Yes  Feeding Feeding Type: Breast Fed Length of feed:  (15 minutes observed)  LATCH Score/Interventions Latch: Grasps breast easily, tongue down, lips flanged, rhythmical sucking.  Audible Swallowing: A few with stimulation Intervention(s): Skin to skin;Hand expression Intervention(s): Skin to skin;Hand expression  Type of Nipple: Everted at rest and after stimulation  Comfort (Breast/Nipple): Soft / non-tender     Hold (Positioning): No assistance needed to correctly position infant at breast. Intervention(s): Skin to skin;Position options;Support Pillows;Breastfeeding basics reviewed  LATCH Score: 9  Lactation Tools Discussed/Used     Consult Status Consult Status: Follow-up Date: 08/02/13 Follow-up type:  In-patient    Beverely RisenShoptaw, Arvella MerlesJana Lynn 08/01/2013, 11:31 PM

## 2013-08-01 NOTE — Progress Notes (Signed)
Attempt to AROM.  No fluid noted.  Will continue to monitor.

## 2013-08-02 ENCOUNTER — Inpatient Hospital Stay (HOSPITAL_COMMUNITY): Admission: RE | Admit: 2013-08-02 | Payer: 59 | Source: Ambulatory Visit

## 2013-08-02 LAB — CBC
HCT: 34.3 % — ABNORMAL LOW (ref 36.0–46.0)
HEMOGLOBIN: 11.8 g/dL — AB (ref 12.0–15.0)
MCH: 30.2 pg (ref 26.0–34.0)
MCHC: 34.4 g/dL (ref 30.0–36.0)
MCV: 87.7 fL (ref 78.0–100.0)
Platelets: 181 10*3/uL (ref 150–400)
RBC: 3.91 MIL/uL (ref 3.87–5.11)
RDW: 12.8 % (ref 11.5–15.5)
WBC: 9.1 10*3/uL (ref 4.0–10.5)

## 2013-08-02 NOTE — Lactation Note (Signed)
This note was copied from the chart of Caitlin Hunter. Lactation Consultation Note  Patient Name: Caitlin Hunter ZOXWR'UToday's Date: 08/02/2013 Reason for consult: Follow-up assessment Mom called for LC to observe latch. Mom had baby latched when I arrived. Good positioning in cross cradle. Baby sleepy but did demonstrated good rhythmic suck. Demonstrated how to bring bottom lip down for more depth. Encouraged to alternate positions each feeding. Monitor void/stools as discussed per previous plan. Keep OP appt. Friday, 08/05/13.   Maternal Data    Feeding Feeding Type: Breast Fed Length of feed: 0 min (circ--sleepy)  LATCH Score/Interventions Latch: Grasps breast easily, tongue down, lips flanged, rhythmical sucking.  Audible Swallowing: A few with stimulation  Type of Nipple: Everted at rest and after stimulation  Comfort (Breast/Nipple): Soft / non-tender     Hold (Positioning): No assistance needed to correctly position infant at breast.  LATCH Score: 9  Lactation Tools Discussed/Used     Consult Status Consult Status: Complete Date: 08/02/13 Follow-up type: In-patient    Caitlin Hunter, Jalie Eiland Ann 08/02/2013, 1:36 PM

## 2013-08-02 NOTE — Anesthesia Postprocedure Evaluation (Signed)
Anesthesia Post Note  Patient: Caitlin Hunter  Procedure(s) Performed: * No procedures listed *  Anesthesia type: Epidural  Patient location: Mother/Baby  Post pain: Pain level controlled  Post assessment: Post-op Vital signs reviewed  Last Vitals:  Filed Vitals:   08/02/13 0610  BP: 128/87  Pulse: 116  Temp: 36.8 C  Resp: 20    Post vital signs: Reviewed  Level of consciousness:alert  Complications: No apparent anesthesia complications

## 2013-08-02 NOTE — Discharge Summary (Signed)
Obstetric Discharge Summary Reason for Admission: induction of labor Prenatal Procedures: ultrasound Intrapartum Procedures: spontaneous vaginal delivery Postpartum Procedures: none Complications-Operative and Postpartum: periurethral  laceration Hemoglobin  Date Value Range Status  08/02/2013 11.8* 12.0 - 15.0 g/dL Final     HCT  Date Value Range Status  08/02/2013 34.3* 36.0 - 46.0 % Final    Physical Exam:  General: alert and cooperative Lochia: appropriate Uterine Fundus: firm Incision: perineum intact DVT Evaluation: No evidence of DVT seen on physical exam. Negative Homan's sign. No cords or calf tenderness. No significant calf/ankle edema.  Discharge Diagnoses: Term Pregnancy-delivered  Discharge Information: Date: 08/02/2013 Activity: pelvic rest Diet: routine Medications: PNV Condition: stable Instructions: refer to practice specific booklet Discharge to: home   Newborn Data: Live born female  Birth Weight: 8 lb 14.5 oz (4040 g) APGAR: 8, 9  Home with mother Pecola LeisureBaby has bilateral hydrocele, uncertain regarding Circ.  CURTIS,CAROL G 08/02/2013, 8:37 AM

## 2013-08-05 ENCOUNTER — Ambulatory Visit (HOSPITAL_COMMUNITY)
Admit: 2013-08-05 | Discharge: 2013-08-05 | Disposition: A | Discharge status: Home / Self Care | Payer: 59 | Attending: Obstetrics and Gynecology | Admitting: Obstetrics and Gynecology

## 2013-08-05 NOTE — Lactation Note (Signed)
Adult Lactation Consultation Outpatient Visit Note  Patient Name: Caitlin ShamesLauren Hodge-Frace                                              Baby Boy Caitlin CuriaHenry Frace, DOB [birth year], Birth weight 8 lb. 14.5 oz. Date of Birth: Mar 06, 1981 Gestational Age at Delivery: 452w2d Type of Delivery:  SVB  Breastfeeding History: Frequency of Breastfeeding: at least 8 times in 24 hours Length of Feeding: 15-30 minutes on average Voids:  5-6 per day Stools: 3 stools since yesterday, brown in color  Supplementing / Method: Pumping:  Type of Pump:   Medela Pump n Style   Frequency:  2 times since d/c home  Volume:  None  Comments: Mom has history of low milk supply. Here today for feeding assessment. Mom reports she thinks baby is nursing well. She has small scab on right nipple causing some mild nipple tenderness. Mom has started supplements More Milk Special Blend by [employer] to encourage milk production. Mom reports the right breast feels full between feedings.    Consultation Evaluation:  Initial Feeding Assessment: Pre-feed Weight:  8 lb. 4.3 oz/3750 gm Post-feed Weight:  8 lb. 4.6 oz/3758 gm Amount Transferred:  8 ml Comments:  From right breast with nursing for 20 minutes. Mom is nursing in cross cradle, assisted Mom to obtain more depth with latch. Baby demonstrates a good suckling rhythm but few swallows noted. Mom's nipple has some compression when baby comes off the breast. Instructed Mom to keep baby closer for deeper latch. Demonstrated bringing bottom/upper lip down.   Additional Feeding Assessment: Pre-feed Weight:   8 lb. 4.6 oz/3758 gm Post-feed Weight:    8 lb. 4.8 oz/3766 gm Amount Transferred: 8 ml Comments:  From left breast with nursing for 15 minutes.  Cross cradle used for this feeding as well. Mom reports she uses football and cross cradle at home. LC advised to alternate positions to drain breast well. Baby more sleepy on this breast. Demonstrated a good suckling pattern, however some  chewing motions noted, few swallows audible.   Additional Feeding Assessment: Pre-feed Weight: Post-feed Weight: Amount Transferred: Comments:  Total Breast milk Transferred this Visit: 16 ml Total Supplement Given:  Mom to give EBM 13 ml back to baby when gets home.  Additional Interventions: Mom post pumped and received 10 ml of EBM from right breast and 2 ml of EBM from left breast. She plans to give this back to the baby when she arrives home. Encouraged Mom to continue supplements. Advised to start post pumping during the day/evening for 15-20 minutes to encourage milk production. Breastfeed baby at night. Be sure upper and lower lip are well flanged, keep baby close for deeper latch.  Advised Mom to supplement with feedings during the day 20-25 ml of EBM or formula till our visit next week for another feeding assessment. Breast feed baby whenever he is hungry but at least 8-12 times in 24 hours, keeping him actively nursing for 15-20 minutes, both breasts if possible. Mom plans to use bottle to supplement, reviewed suck training and paced feedings.  Monitor voids/stools. Recipe given for Lactation Cookies  Follow-Up Lactation OP appt. 08/10/13 at 0900.     Alfred LevinsGranger, Amoria Mclees Ann 08/05/2013, 9:27 AM

## 2013-08-10 ENCOUNTER — Ambulatory Visit (HOSPITAL_COMMUNITY)
Admission: RE | Admit: 2013-08-10 | Discharge: 2013-08-10 | Disposition: A | Discharge status: Home / Self Care | Payer: 59 | Source: Ambulatory Visit | Attending: Obstetrics and Gynecology | Admitting: Obstetrics and Gynecology

## 2013-08-10 NOTE — Lactation Note (Signed)
Infant Lactation Consultation Outpatient Visit Note  Patient Name: Caitlin ShamesLauren Hodge-Frace Date of Birth: [birth year] Birth Weight:   Gestational Age at Delivery: Gestational Age: <None> Type of Delivery: Vaginal Delivery  BW- 8-14 oz D/C - 1st Dr. Visit - 8-4 oz   ( per mom had been at 7% weight loss)  LC visit 1/15 - 8-4 oz BFSG 1/20- 8-2.7 oz Today's weight - 8-4 oz  Reason for visit today - F/U LC visit due to slow weight gain ,  Breastfeeding History- Per mom reports 1st pregnancy , no breast changes , this pregnancy - change in size , no sore ness.  Milk came in 1/15 - 1/16 , denies engorgement , ( Questionable over full x1 ). Per mom taking Mother Love 2 tabs 3 X's a day  ( Fenugreek , Goats rue, Bless[place]) Since 08/03/13. Due to history of decreased milk supply with 1st baby.  Frequency of Breastfeeding: every 3 hours  Length of Feeding: for 30 mins , ( 1st breast may be longer and then switch )  Voids: 5  Stools: 4 yellowish -brown - orange , seedy stool  Supplementing / Method: Supplementing with EBM after 3 feedings 10 ml  Pumping:  Type of Pump:DEBP ( Medela )    Frequency:after 7 feedings   Volume:  10 ml up to 15 ml , ( per mom never got'en above 20 ml )   Comments: Using #27 flange , per mom a good fit , LC recommended trying #24 Standard , check comfort. If comfortable may increase volume post pumping   Last feeding at home at 0800 for 30 mins and supplemented 10 ml   Consultation Evaluation: - Both breast some what full , ( no engorgement noted ) , nipples pink , no  breakdown noted . Per mom have have been sore but improving.  Initial Feeding Assessment: Left breast  Pre-feed Weight:8-4 oz 3742 g ( wet changed , small stool ) , re-weight - 8-3.1 oz 3718 g  Post-feed Weight: 8-3.8 oz 3736 g  Amount Transferred: 18 ml  Comments: Reviewed basics with mom , breast massage , hand express, breast compressions until Sherilyn CooterHenry is deeply latched. Per mom latch felt  better. Also flipped upper lip outward and eased chin down for more stimulation of the areola. Noted  Multiply swallows , increased with breast compressions. Baby feed on the left for 15 -20 mins , noted a intermittent consistent feeding pattern.  Additional Feeding Assessment: Right Breast  Pre-feed Weight:8-3.8 oz 3736 g  Post-feed Weight: 8-4.0 oz 3744 g  Amount Transferred: 8 ml  Comments: LC watched mom latching in football position,LC assisted to flip upper lip to flanged position and eased down on chin . Mom reports comfort.  Baby fed for 15 mins and released , noted multiply swallows, and consistent pattern. Nipple at base well rounded.   Total Breast milk Transferred this Visit: 26 ml  Total Supplement Given: Per mom plans to supplement at home. LC recommended increasing supplementing volume  to 35 - 40 ml after each feeding ( EBM and of formula ) with a broader based nipple ( Medela ), LC stressed the importance of keeping the baby's energy level Up by increasing the calories after each feeding,therefore working on the weight gain. ( Goal - up to birth weight by 14 days )   Lactation Plan of Care -  Mom - encouraged her to nap, rest , plenty Fluids ( esp H20 ) , nutritious snacks and meals.                                        -  Feedings- continue to feed with cues and every 2-3 hours , ( Cluster feedings are normal )                                         - Steps for latching - Massage , hand express, breast compressions, and then intermittent with feeding                                         - Supplementing- Change artificial nipple to broader base Medela ( Per mom already has them at home )                                         - After every feeding supplement with EBM and or formula 35-40 ml                                         - Pumping - after 6-8  Feedings  A day , save milk and feed back to baby                                         - Continue Mother's Love  supplement                                         - Oatmeal ( all natural every day ( cereal or lactation cookies                            Praised mom for her breast feeding efforts , especially due to multi - tasking and having a sick 50 month old at home   Please see above under supplementing - discussion and recommendation for supplementing  To meet goals of weight gain .    Follow-Up-  Per mom Dr. Excell Seltzer tomorrow for a weight check 08/11/13 at 1030 am                    - Per mom Smart start Monday 1/26 at home                    - [facility] recommended attending the breast feeding support group for weekly weight checks , and support ( LC offered F/U [facility] visit        and mom preferred the BFSG for F/U )        Kathrin Greathouse 08/10/2013, 9:36 AM

## 2013-09-07 ENCOUNTER — Ambulatory Visit (HOSPITAL_COMMUNITY)
Admission: RE | Admit: 2013-09-07 | Discharge: 2013-09-07 | Disposition: A | Discharge status: Home / Self Care | Payer: 59 | Source: Ambulatory Visit | Attending: Obstetrics and Gynecology | Admitting: Obstetrics and Gynecology

## 2013-09-07 DIAGNOSIS — O923 Agalactia: Secondary | ICD-10-CM | POA: Insufficient documentation

## 2013-09-07 NOTE — Lactation Note (Signed)
Adult Lactation Consultation Outpatient Visit Note  Patient Name: Caitlin Hunter       BABY: Caitlin CuriaHENRY Hunter Date of Birth: 09-17-1980                           DOB: [birth year] Gestational Age at Delivery: 4040.2             BIRTH WEIGHT: 8-14.5 Type of Delivery: NVD                               WEIGHT OP 08/10/13: 8-4                                                                     WEIGHT TODAY: 11.9 Breastfeeding History: Frequency of Breastfeeding: EVERY 3 HOURS Length of Feeding: 20-25 MINUTES EACH BREAST Voids: qs Stools: qs  Supplementing / Method:EBM/formula 60 mls if supplement after feeding/3-4 oz if in place of breastfeeding Pumping:  Type of Pump:Medela pump in style   Frequency:post pumps after each feeding  Volume:  20-75 mls  Comments:    Consultation Evaluation:  Mom and 215 week old baby here for feeding assessment.  Mom has a history of low milk supply and baby had slow weight gain.  Baby is now gaining well since she increased the supplementation this past month.  Mom recently exclusively pumped x 2 days and average volume was 15 oz/24 hours.  Baby is needing 30 oz per day.  Mom states she has had increased volume today and she has already pumped 7 oz.  Observed baby latch to both breasts in cross cradle position.  Baby latches fairly deep and needed gentle chin tug.  Mom has fairly large nipples and nipples elongated after feeding.  Mom uses good technique and breast massage during the feeding.  Baby feeds with nutritive suck but does pull off and on some.  Baby transferred 60 mls which is increased from 26 mls 4 weeks ago.  Discussed trying to take a 24 hour period of marathon, exclusive breastfeeding to attempt to increase supply.  She will do this when her husband is home to care for their 5517 month old.  Also discussed talking to physician about reglan.  Mom has a great attitude and very motivated.  Initial Feeding Assessment:Left breast x 20 minutes Pre-feed  Weight:5258 Post-feed Weight:5298 Amount Transferred:40 mls Comments:  Additional Feeding Assessment:right breast x 15 minutes Pre-feed Weight:5298 Post-feed Weight:5318 Amount Transferred:20 mls Comments:  Additional Feeding Assessment: Pre-feed Weight: Post-feed Weight: Amount Transferred: Comments:  Total Breast milk Transferred this Visit: 60 mls Total Supplement Given: None  Additional Interventions:   Follow-Up Will call prn      Hansel Feinsteinowell, Aizah Gehlhausen Ann 09/07/2013, 12:48 PM

## 2014-05-22 ENCOUNTER — Encounter (HOSPITAL_COMMUNITY): Payer: Self-pay

## 2016-10-21 ENCOUNTER — Other Ambulatory Visit (HOSPITAL_COMMUNITY): Payer: Self-pay | Admitting: Obstetrics and Gynecology

## 2016-10-21 DIAGNOSIS — N83291 Other ovarian cyst, right side: Secondary | ICD-10-CM

## 2016-10-24 ENCOUNTER — Ambulatory Visit (HOSPITAL_COMMUNITY)
Admission: RE | Admit: 2016-10-24 | Discharge: 2016-10-24 | Disposition: A | Discharge status: Home / Self Care | Payer: 59 | Source: Ambulatory Visit | Attending: Obstetrics and Gynecology | Admitting: Obstetrics and Gynecology

## 2016-10-24 ENCOUNTER — Encounter (HOSPITAL_COMMUNITY): Payer: Self-pay | Admitting: Radiology

## 2016-10-24 DIAGNOSIS — N8311 Corpus luteum cyst of right ovary: Secondary | ICD-10-CM | POA: Diagnosis not present

## 2016-10-24 DIAGNOSIS — N83291 Other ovarian cyst, right side: Secondary | ICD-10-CM | POA: Diagnosis present

## 2016-10-24 MED ORDER — IOPAMIDOL (ISOVUE-300) INJECTION 61%
100.0000 mL | Freq: Once | INTRAVENOUS | Status: AC | PRN
  Administered 2016-10-24: 100 mL via INTRAVENOUS

## 2016-11-03 ENCOUNTER — Encounter: Payer: Self-pay | Admitting: Gynecologic Oncology

## 2016-11-03 ENCOUNTER — Telehealth: Payer: Self-pay | Admitting: Gynecologic Oncology

## 2016-11-03 NOTE — Telephone Encounter (Signed)
Appt has been scheduled for the pt to see Dr. Andrey Farmer on 5/7 . Letter mailed to the pt and faxed to the referring.

## 2016-11-05 ENCOUNTER — Telehealth: Payer: Self-pay | Admitting: *Deleted

## 2016-11-05 NOTE — Telephone Encounter (Signed)
Patient called and moved her appt form Monday May 7th to Wednesday May 9th. Patient aware

## 2016-11-24 ENCOUNTER — Ambulatory Visit: Payer: 59 | Admitting: Gynecologic Oncology

## 2016-11-26 ENCOUNTER — Ambulatory Visit: Payer: 59 | Attending: Gynecologic Oncology | Admitting: Gynecologic Oncology

## 2016-11-26 ENCOUNTER — Encounter: Payer: Self-pay | Admitting: Gynecologic Oncology

## 2016-11-26 DIAGNOSIS — Z8041 Family history of malignant neoplasm of ovary: Secondary | ICD-10-CM

## 2016-11-26 DIAGNOSIS — N83201 Unspecified ovarian cyst, right side: Secondary | ICD-10-CM | POA: Insufficient documentation

## 2016-11-26 NOTE — Progress Notes (Signed)
Consult Note: Gyn-Onc  Consult was requested by Dr. Gaetano Net for the evaluation of Annalyn Hodge-Frace 36 y.o. female  CC:  Chief Complaint  Patient presents with  . Ovarian Cyst    Assessment/Plan:  Ms. Cleva Camero  is a 36 y.o.  year old with a family history of ovarian cancer (mother) and a 2.6cm complex right ovarian cyst with vascular papillary projects. This is associated with a normal CA 125.  I suspect that this lesion is benign. However, it is appropriate to repeat imaging 6-8 weeks after her last [place] (on 10/21/16).  If it is reduced in size, then I recommend no further follow-up.  If it is stable or growing, we will discuss surgery (minimally invasive right salpingo-oophorectomy).   HPI: 36 year old G2P2 who is seen in consultation at the request of Dr Gaetano Net for a complex right ovarian cyst.  The patient's mother recently died from ovarian cancer after an 8 year battle. She was BRCA negative.  The patient was noted to have a cervical polyp on exam in April, 2018 and underwent a TVUS on 10/21/16 to better characterize the endometrium. This identified a 2.6x1.6cm complex cystic mass on the right ovary with low level echoes and some blood flow. CA 125 on 10/21/16 was normal at 11. CT abdo/pelvis on 10/24/16 revealed a cyst suggestive of a corpus luteum but no other concerning features. An endometrial biopsy on 10/21/16 was benign.   She is otherwise very healthy, with 2 prior vaginal deliveries and no surgical history.   Current Meds:  Outpatient Encounter Prescriptions as of 11/26/2016  Medication Sig  . [DISCONTINUED] Prenatal Vit-Fe Fumarate-FA (PRENATAL MULTIVITAMIN) TABS Take 1 tablet by mouth daily.   No facility-administered encounter medications on file as of 11/26/2016.     Allergy:  Allergies  Allergen Reactions  . Aspirin Swelling and Rash    Pt. States she can tolerate Ibuprofen fine.    Social Hx:   Social History   Social History  . Marital status:  Married    Spouse name: N/A  . Number of children: N/A  . Years of education: N/A   Occupational History  . Not on file.   Social History Main Topics  . Smoking status: Never Smoker  . Smokeless tobacco: Never Used  . Alcohol use No  . Drug use: No  . Sexual activity: Yes    Birth control/ protection: Condom   Other Topics Concern  . Not on file   Social History Narrative  . No narrative on file    Past Surgical Hx:  Past Surgical History:  Procedure Laterality Date  . LEEP      Past Medical Hx:  Past Medical History:  Diagnosis Date  . Abnormal Pap smear   . Dysrhythmia    hx bradycardia  . H/O varicella     Past Gynecological History:  SVD x 2 No LMP recorded.  Family Hx:  Family History  Problem Relation Age of Onset  . Multiple sclerosis Mother   . Cancer Mother     ovarian  . Diabetes Father   . Kidney disease Father   . Depression Maternal Aunt   . Depression Maternal Uncle   . Cancer Paternal Aunt     lymphoma  . Depression Maternal Grandmother   . Cancer Paternal Grandfather     kidney  . Mental retardation Cousin     2nd cousin trisomy 67    Review of Systems:  Constitutional  Feels well,  ENT Normal appearing ears and nares bilaterally Skin/Breast  No rash, sores, jaundice, itching, dryness Cardiovascular  No chest pain, shortness of breath, or edema  Pulmonary  No cough or wheeze.  Gastro Intestinal  No nausea, vomitting, or diarrhoea. No bright red blood per rectum, no abdominal pain, change in bowel movement, or constipation.  Genito Urinary  No frequency, urgency, dysuria, no pain Musculo Skeletal  No myalgia, arthralgia, joint swelling or pain  Neurologic  No weakness, numbness, change in gait,  Psychology  No depression, anxiety, insomnia.   Vitals:  Blood pressure 115/67, pulse 61, temperature 98.1 F (36.7 C), temperature source Oral, resp. rate 18, height 5' 6"  (1.676 m), weight 167 lb 9.6 oz (76 kg), unknown if  currently breastfeeding.  Physical Exam: WD in NAD Neck  Supple NROM, without any enlargements.  Lymph Node Survey No cervical supraclavicular or inguinal adenopathy Cardiovascular  Pulse normal rate, regularity and rhythm. S1 and S2 normal.  Lungs  Clear to auscultation bilateraly, without wheezes/crackles/rhonchi. Good air movement.  Skin  No rash/lesions/breakdown  Psychiatry  Alert and oriented to person, place, and time  Abdomen  Normoactive bowel sounds, abdomen soft, non-tender and thin without evidence of hernia.  Back No CVA tenderness Genito Urinary  Vulva/vagina: Normal external female genitalia.   No lesions. No discharge or bleeding.  Bladder/urethra:  No lesions or masses, well supported bladder  Vagina: normal  Cervix: Normal appearing, no lesions.  Uterus:  Small, mobile, no parametrial involvement or nodularity.  Adnexa: no palpable masses. Rectal  deferred Extremities  No bilateral cyanosis, clubbing or edema.   Donaciano Eva, MD  11/26/2016, 9:44 AM

## 2016-11-26 NOTE — Patient Instructions (Signed)
Plan to have a repeat ultrasound with Dr. Huel Coventryomblin's office in two weeks.  If the cyst is unchanged or larger, we will have you see Dr. Andrey Farmerossi to discuss surgery.  If it has reduced, no follow up needed.  Please call our office for any questions or concerns.

## 2020-10-03 LAB — HM PAP SMEAR

## 2021-10-02 DIAGNOSIS — Z1231 Encounter for screening mammogram for malignant neoplasm of breast: Secondary | ICD-10-CM | POA: Diagnosis not present

## 2021-10-03 ENCOUNTER — Other Ambulatory Visit: Payer: Self-pay | Admitting: Obstetrics and Gynecology

## 2021-10-18 DIAGNOSIS — Z8051 Family history of malignant neoplasm of kidney: Secondary | ICD-10-CM | POA: Diagnosis not present

## 2021-10-18 DIAGNOSIS — Z6828 Body mass index (BMI) 28.0-28.9, adult: Secondary | ICD-10-CM | POA: Diagnosis not present

## 2021-10-18 DIAGNOSIS — Z8041 Family history of malignant neoplasm of ovary: Secondary | ICD-10-CM | POA: Diagnosis not present

## 2021-10-18 DIAGNOSIS — Z01419 Encounter for gynecological examination (general) (routine) without abnormal findings: Secondary | ICD-10-CM | POA: Diagnosis not present

## 2021-10-21 ENCOUNTER — Ambulatory Visit
Admission: RE | Admit: 2021-10-21 | Discharge: 2021-10-21 | Disposition: A | Discharge status: Home / Self Care | Payer: 59 | Source: Ambulatory Visit | Attending: Obstetrics and Gynecology | Admitting: Obstetrics and Gynecology

## 2021-10-21 ENCOUNTER — Ambulatory Visit
Admission: RE | Admit: 2021-10-21 | Discharge: 2021-10-21 | Disposition: A | Discharge status: Home / Self Care | Payer: BC Managed Care – PPO | Source: Ambulatory Visit | Attending: Obstetrics and Gynecology | Admitting: Obstetrics and Gynecology

## 2021-10-21 DIAGNOSIS — R922 Inconclusive mammogram: Secondary | ICD-10-CM | POA: Diagnosis not present

## 2021-10-21 DIAGNOSIS — R928 Other abnormal and inconclusive findings on diagnostic imaging of breast: Secondary | ICD-10-CM

## 2021-12-05 DIAGNOSIS — Z809 Family history of malignant neoplasm, unspecified: Secondary | ICD-10-CM | POA: Diagnosis not present

## 2021-12-05 DIAGNOSIS — N920 Excessive and frequent menstruation with regular cycle: Secondary | ICD-10-CM | POA: Diagnosis not present

## 2022-02-10 DIAGNOSIS — R946 Abnormal results of thyroid function studies: Secondary | ICD-10-CM | POA: Diagnosis not present

## 2022-02-10 DIAGNOSIS — Z0000 Encounter for general adult medical examination without abnormal findings: Secondary | ICD-10-CM | POA: Diagnosis not present

## 2022-02-10 DIAGNOSIS — R7309 Other abnormal glucose: Secondary | ICD-10-CM | POA: Diagnosis not present

## 2022-02-17 DIAGNOSIS — R946 Abnormal results of thyroid function studies: Secondary | ICD-10-CM | POA: Diagnosis not present

## 2022-02-17 DIAGNOSIS — Z0000 Encounter for general adult medical examination without abnormal findings: Secondary | ICD-10-CM | POA: Diagnosis not present

## 2022-02-17 DIAGNOSIS — R7309 Other abnormal glucose: Secondary | ICD-10-CM | POA: Diagnosis not present

## 2022-04-09 DIAGNOSIS — N924 Excessive bleeding in the premenopausal period: Secondary | ICD-10-CM | POA: Diagnosis not present

## 2022-04-16 DIAGNOSIS — D251 Intramural leiomyoma of uterus: Secondary | ICD-10-CM | POA: Diagnosis not present

## 2022-04-16 DIAGNOSIS — N838 Other noninflammatory disorders of ovary, fallopian tube and broad ligament: Secondary | ICD-10-CM | POA: Diagnosis not present

## 2022-04-16 DIAGNOSIS — N72 Inflammatory disease of cervix uteri: Secondary | ICD-10-CM | POA: Diagnosis not present

## 2022-04-16 DIAGNOSIS — N888 Other specified noninflammatory disorders of cervix uteri: Secondary | ICD-10-CM | POA: Diagnosis not present

## 2022-04-16 DIAGNOSIS — N920 Excessive and frequent menstruation with regular cycle: Secondary | ICD-10-CM | POA: Diagnosis not present

## 2022-05-07 DIAGNOSIS — Z23 Encounter for immunization: Secondary | ICD-10-CM | POA: Diagnosis not present

## 2022-11-20 ENCOUNTER — Other Ambulatory Visit: Payer: Self-pay | Admitting: Obstetrics and Gynecology

## 2022-11-20 DIAGNOSIS — N63 Unspecified lump in unspecified breast: Secondary | ICD-10-CM

## 2022-11-28 ENCOUNTER — Ambulatory Visit
Admission: RE | Admit: 2022-11-28 | Discharge: 2022-11-28 | Disposition: A | Discharge status: Home / Self Care | Payer: BC Managed Care – PPO | Source: Ambulatory Visit | Attending: Obstetrics and Gynecology | Admitting: Obstetrics and Gynecology

## 2022-11-28 ENCOUNTER — Ambulatory Visit
Admission: RE | Admit: 2022-11-28 | Discharge: 2022-11-28 | Disposition: A | Discharge status: Home / Self Care | Payer: 59 | Source: Ambulatory Visit | Attending: Obstetrics and Gynecology | Admitting: Obstetrics and Gynecology

## 2022-11-28 DIAGNOSIS — N63 Unspecified lump in unspecified breast: Secondary | ICD-10-CM

## 2023-05-20 DIAGNOSIS — E7800 Pure hypercholesterolemia, unspecified: Secondary | ICD-10-CM | POA: Insufficient documentation

## 2023-08-19 ENCOUNTER — Ambulatory Visit (INDEPENDENT_AMBULATORY_CARE_PROVIDER_SITE_OTHER): Payer: 59 | Admitting: Urgent Care

## 2023-08-19 VITALS — BP 110/70 | HR 68 | Ht 66.0 in | Wt 186.4 lb

## 2023-08-19 DIAGNOSIS — R635 Abnormal weight gain: Secondary | ICD-10-CM | POA: Diagnosis not present

## 2023-08-19 DIAGNOSIS — F3289 Other specified depressive episodes: Secondary | ICD-10-CM | POA: Diagnosis not present

## 2023-08-19 DIAGNOSIS — G44209 Tension-type headache, unspecified, not intractable: Secondary | ICD-10-CM

## 2023-08-19 DIAGNOSIS — Z833 Family history of diabetes mellitus: Secondary | ICD-10-CM | POA: Diagnosis not present

## 2023-08-19 DIAGNOSIS — F32A Depression, unspecified: Secondary | ICD-10-CM | POA: Insufficient documentation

## 2023-08-19 MED ORDER — VENLAFAXINE HCL ER 75 MG PO CP24: 75.0000 mg | ORAL_CAPSULE | Freq: Every day | ORAL | 2 refills | Status: DC

## 2023-08-19 MED ORDER — PHENTERMINE HCL 15 MG PO CAPS: 15.0000 mg | ORAL_CAPSULE | ORAL | 0 refills | Status: DC

## 2023-08-19 NOTE — Assessment & Plan Note (Signed)
Weight Gain Difficulty losing weight post-hysterectomy, concern for potential metabolic and hormonal changes. -Start Phentermine 15mg  for initial weight loss, with potential to increase dose if well-tolerated. -Continue lifestyle modifications including diet and exercise.

## 2023-08-19 NOTE — Patient Instructions (Addendum)
Start taking venlafaxine once daily.  Cut wellbutrin in 1/2 - take 1/2 daily x 1 week, then 1/2 every other day x1 week, then stop  Start phentermine in 1 week. Monitor for side effects including racing heart or jitteriness.  [facility] [address], [place], [place] [address] Phone: [phone]  Please call to schedule a dry needling session with Corlis Leak  Please return in 6 weeks for a recheck and follow up.

## 2023-08-19 NOTE — Assessment & Plan Note (Signed)
Depression Currently managed with Bupropion, considering transition to Venlafaxine. -Taper off Bupropion over two weeks while starting Venlafaxine 75mg .

## 2023-08-19 NOTE — Progress Notes (Unsigned)
New Patient Office Visit  Subjective:  Patient ID: Caitlin Hunter, female    DOB: [birth year]  Age: 43 y.o. MRN: [id number]  CC:  Chief Complaint  Patient presents with  . Establish Care    New pt est care. Frequent headaches and discuss weight loss options. Pt sees [facility]   Discussed the use of AI software for clinical note transcription with the patient who gave verbal consent to proceed.  HPI Caitlin Hunter presents to establish care.  The patient is a 43 year old female who presents with headaches and weight management concerns post-hysterectomy.  She experiences migraines, with a period of eight weeks where this occurred weekly, leading to rapid use of her prescribed Ubrelvy. The frequency of migraines has since decreased. The headaches are severe, often keeping her bedridden, and are associated with nausea and neck tension. No smoking history.   Following a hysterectomy in September due to heavy bleeding, she has struggled with weight management despite dietary and exercise efforts. Previously maintaining a weight of around 170 lbs, she is now unable to drop below 180 lbs. She is concerned about potential health risks associated with her weight, especially given her family history of diabetes and early deaths. She has a history of being heavier, weighing 210 lbs in high school. She engages in physical activity, such as walking, two to three times a week.  She is currently on bupropion for depression, which she has been taking for three to four years. She describes her mental health as 'okay' but acknowledges feeling overwhelmed at times due to life stressors, including a demanding job and family responsibilities. She has two children, aged ten and eleven. She has attempted weight loss medication in the past, specifically a combination of naltrexone and bupropion, but did not tolerate it well and suspected it contributed to her headaches.  She has a family  history of gynecological cancers; her mother died from ovarian cancer at age 71. Her father had type 2 diabetes and died from a heart attack related to diabetes complications at age 38. She is concerned about her own health risks given this family history.   Outpatient Encounter Medications as of 08/19/2023  Medication Sig  . buPROPion (WELLBUTRIN XL) 150 MG 24 hr tablet Take 150 mg by mouth daily.  . phentermine 15 MG capsule Take 1 capsule (15 mg total) by mouth every morning.  . venlafaxine XR (EFFEXOR-XR) 75 MG 24 hr capsule Take 1 capsule (75 mg total) by mouth daily with breakfast.   No facility-administered encounter medications on file as of 08/19/2023.    Past Medical History:  Diagnosis Date  . Abnormal Pap smear   . Allergy 1995   Asprin and seasonal  . Depression 2022  . Dysrhythmia    hx bradycardia  . H/O varicella     Past Surgical History:  Procedure Laterality Date  . ABDOMINAL HYSTERECTOMY  2023  . LEEP      Family History  Problem Relation Age of Onset  . Multiple sclerosis Mother   . Cancer Mother        ovarian  . Asthma Mother   . Diabetes Father   . Kidney disease Father   . Cancer Father   . Depression Maternal Aunt   . Depression Maternal Uncle   . Cancer Paternal Aunt        lymphoma  . Depression Maternal Grandmother   . Arthritis Maternal Grandmother   . Cancer Paternal Grandfather  kidney  . Mental retardation Cousin        2nd cousin trisomy 42    Social History   Socioeconomic History  . Marital status: Married    Spouse name: Not on file  . Number of children: Not on file  . Years of education: Not on file  . Highest education level: Bachelor's degree (e.g., BA, AB, BS)  Occupational History  . Not on file  Tobacco Use  . Smoking status: Never  . Smokeless tobacco: Never  Substance and Sexual Activity  . Alcohol use: Yes    Alcohol/week: 1.0 standard drink of alcohol    Types: 1 Glasses of wine per week  . Drug  use: No  . Sexual activity: Yes    Birth control/protection: Condom  Other Topics Concern  . Not on file  Social History Narrative  . Not on file   Social Drivers of Health   Financial Resource Strain: Low Risk  (08/19/2023)   Overall Financial Resource Strain (CARDIA)   . Difficulty of Paying Living Expenses: Not very hard  Food Insecurity: No Food Insecurity (08/19/2023)   Hunger Vital Sign   . Worried About Programme researcher, broadcasting/film/video in the Last Year: Never true   . Ran Out of Food in the Last Year: Never true  Transportation Needs: No Transportation Needs (08/19/2023)   PRAPARE - Transportation   . Lack of Transportation (Medical): No   . Lack of Transportation (Non-Medical): No  Physical Activity: Insufficiently Active (08/19/2023)   Exercise Vital Sign   . Days of Exercise per Week: 2 days   . Minutes of Exercise per Session: 40 min  Stress: No Stress Concern Present (08/19/2023)   [employer] of Occupational Health - Occupational Stress Questionnaire   . Feeling of Stress : Only a little  Social Connections: Moderately Integrated (08/19/2023)   Social Connection and Isolation Panel [NHANES]   . Frequency of Communication with Friends and Family: Once a week   . Frequency of Social Gatherings with Friends and Family: Once a week   . Attends Religious Services: More than 4 times per year   . Active Member of Clubs or Organizations: Yes   . Attends Banker Meetings: More than 4 times per year   . Marital Status: Married  Catering manager Violence: Not on file    ROS: as noted in HPI  Objective:  BP 110/70   Pulse 68   Ht 5\' 6"  (1.676 m)   Wt 186 lb 6.4 oz (84.6 kg)   LMP  (LMP Unknown)   SpO2 98%   Breastfeeding No   BMI 30.09 kg/m   Physical Exam  Last CBC Lab Results  Component Value Date   WBC 9.1 08/02/2013   HGB 11.8 (L) 08/02/2013   HCT 34.3 (L) 08/02/2013   MCV 87.7 08/02/2013   MCH 30.2 08/02/2013   RDW 12.8 08/02/2013   PLT 181  08/02/2013   Last metabolic panel No results found for: "GLUCOSE", "NA", "K", "CL", "CO2", "BUN", "CREATININE", "EGFR", "CALCIUM", "PHOS", "PROT", "ALBUMIN", "LABGLOB", "AGRATIO", "BILITOT", "ALKPHOS", "AST", "ALT", "ANIONGAP"    Assessment & Plan:  Tension headache Assessment & Plan: Migraine Headaches Increased frequency of migraines, currently managed with Ubrelvy. Possible tension headaches contributing to migraines. -Consider transitioning from Bupropion to Venlafaxine for potential dual benefit of depression and migraine prevention. -Start Venlafaxine 75mg  and taper off Bupropion over two weeks. -Refer to physical therapy for management of neck tension. -Continue use of Ubrelvy as needed.  Orders: -     Ambulatory referral to Physical Therapy  Weight gain Assessment & Plan: Weight Gain Difficulty losing weight post-hysterectomy, concern for potential metabolic and hormonal changes. -Start Phentermine 15mg  for initial weight loss, with potential to increase dose if well-tolerated. -Continue lifestyle modifications including diet and exercise.  Orders: -     Phentermine HCl; Take 1 capsule (15 mg total) by mouth every morning.  Dispense: 30 capsule; Refill: 0  Other depression Assessment & Plan: Depression Currently managed with Bupropion, considering transition to Venlafaxine. -Taper off Bupropion over two weeks while starting Venlafaxine 75mg .  Orders: -     Venlafaxine HCl ER; Take 1 capsule (75 mg total) by mouth daily with breakfast.  Dispense: 30 capsule; Refill: 2  Family history of diabetes mellitus (DM)  Hypercholesterolemia Total cholesterol 223, LDL 143, HDL 81. Low cardiovascular risk based on current profile. -No pharmacological intervention needed at this time. -Continue lifestyle modifications.  Follow-up in 4-6 weeks to assess response to Venlafaxine and Phentermine.  Return in about 6 weeks (around 09/30/2023).   Maretta Bees, PA

## 2023-08-19 NOTE — Assessment & Plan Note (Signed)
Migraine Headaches Increased frequency of migraines, currently managed with Ubrelvy. Possible tension headaches contributing to migraines. -Consider transitioning from Bupropion to Venlafaxine for potential dual benefit of depression and migraine prevention. -Start Venlafaxine 75mg  and taper off Bupropion over two weeks. -Refer to physical therapy for management of neck tension. -Continue use of Ubrelvy as needed.

## 2023-08-30 IMAGING — US US BREAST*R* LIMITED INC AXILLA
1 series · 9 of 9 positions shown · non-contrast
Comparison: Previous exam(s).

CLINICAL DATA: Patient returns today to evaluate a possible RIGHT
breast mass questioned on recent screening mammogram.

EXAM:
DIGITAL DIAGNOSTIC UNILATERAL RIGHT MAMMOGRAM WITH TOMOSYNTHESIS AND
CAD; ULTRASOUND RIGHT BREAST LIMITED
TECHNIQUE: Right digital diagnostic mammography and breast tomosynthesis was
performed. The images were evaluated with computer-aided detection.;
Targeted ultrasound examination of the right breast was performed

[Series 1: us breast*right* limited inc axilla · 0.06mm/px · 9 of 9 slices shown]
[im 1/9]
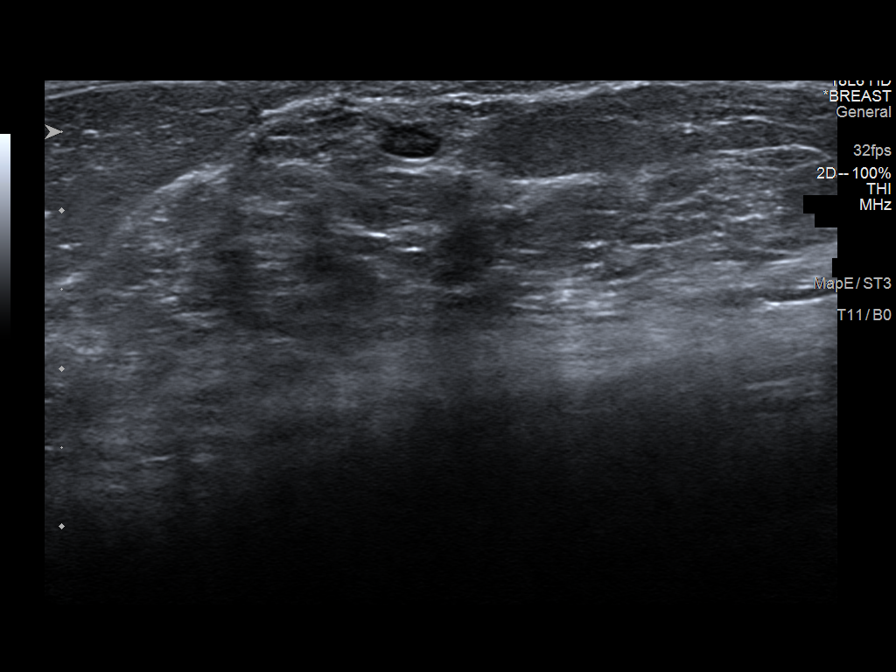
[im 2/9]
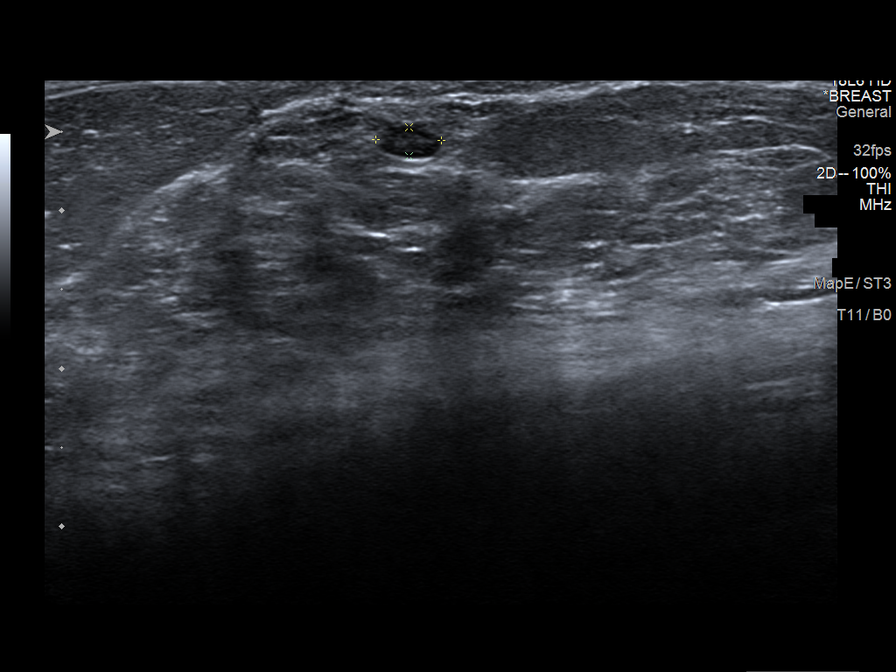
[im 3/9]
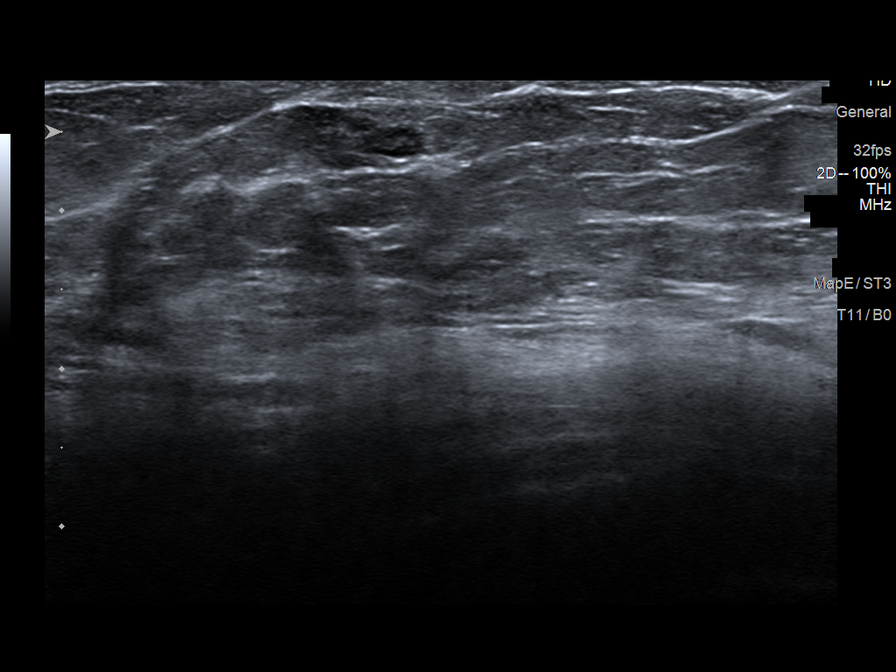
[im 4/9]
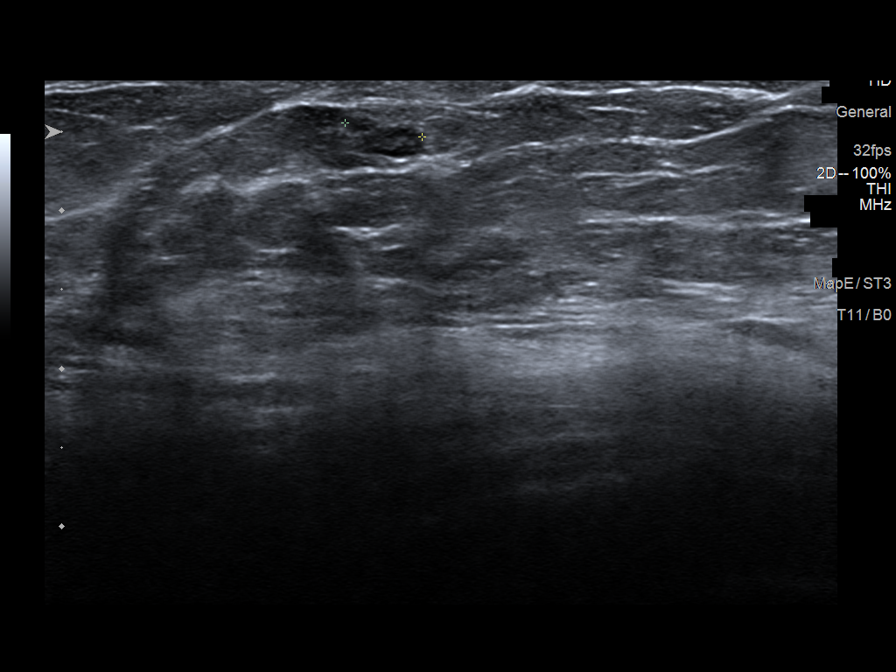
[im 5/9]
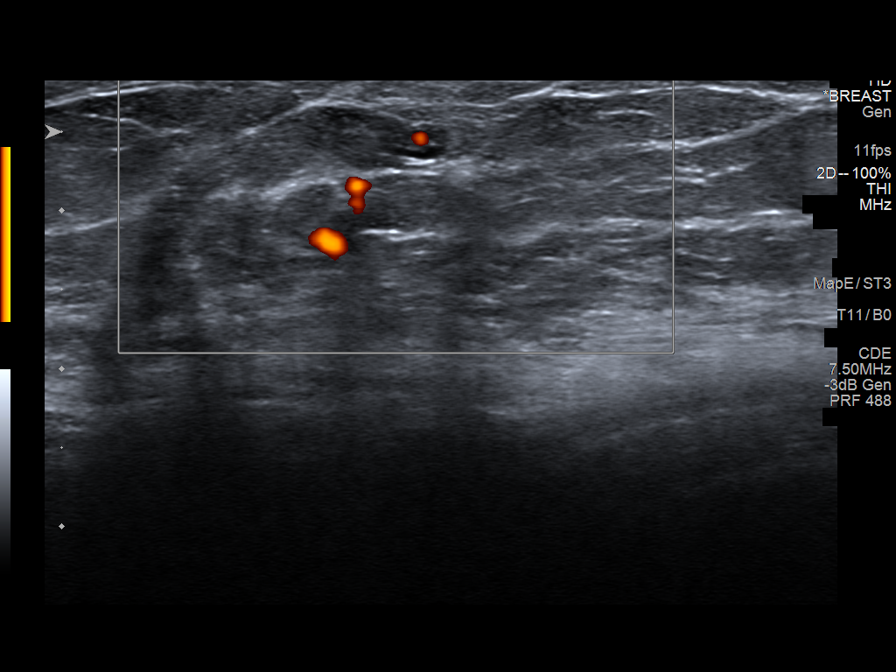
[im 6/9]
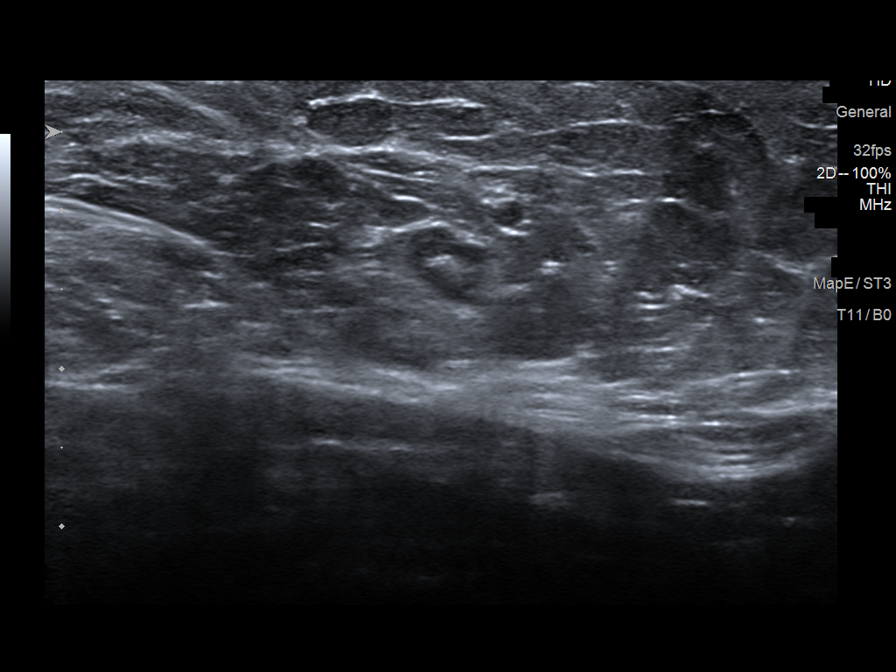
[im 7/9]
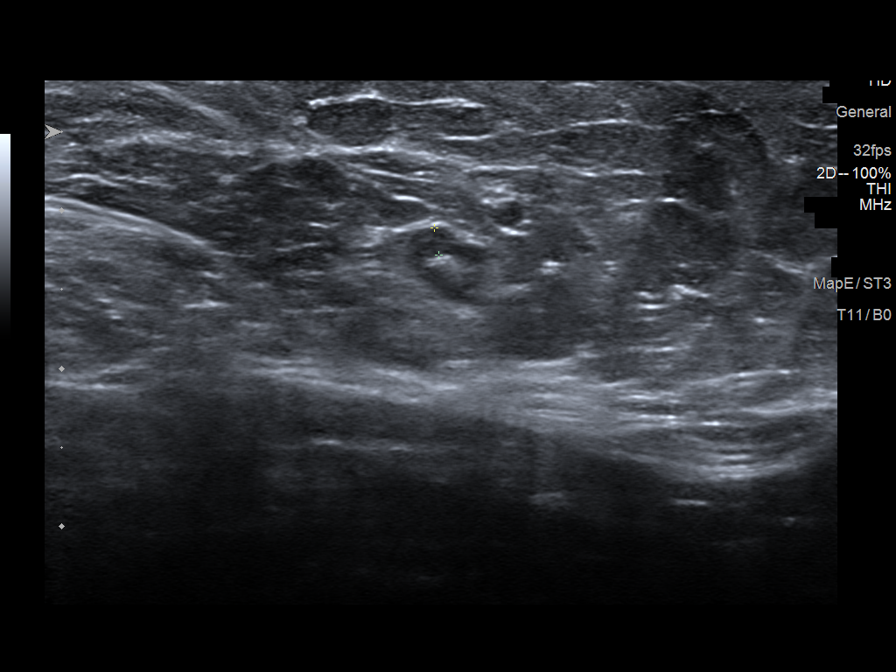
[im 8/9]
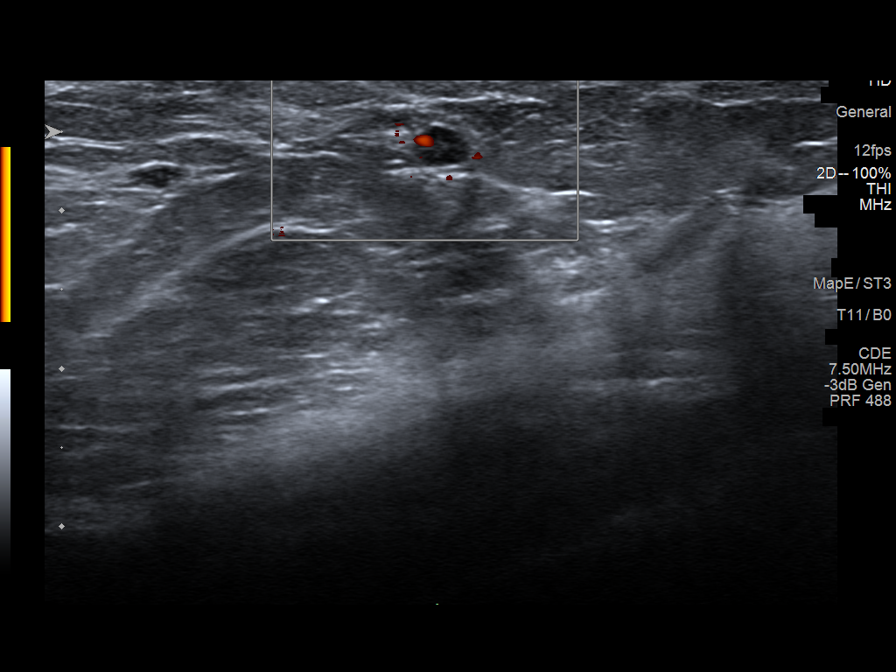
[im 9/9]
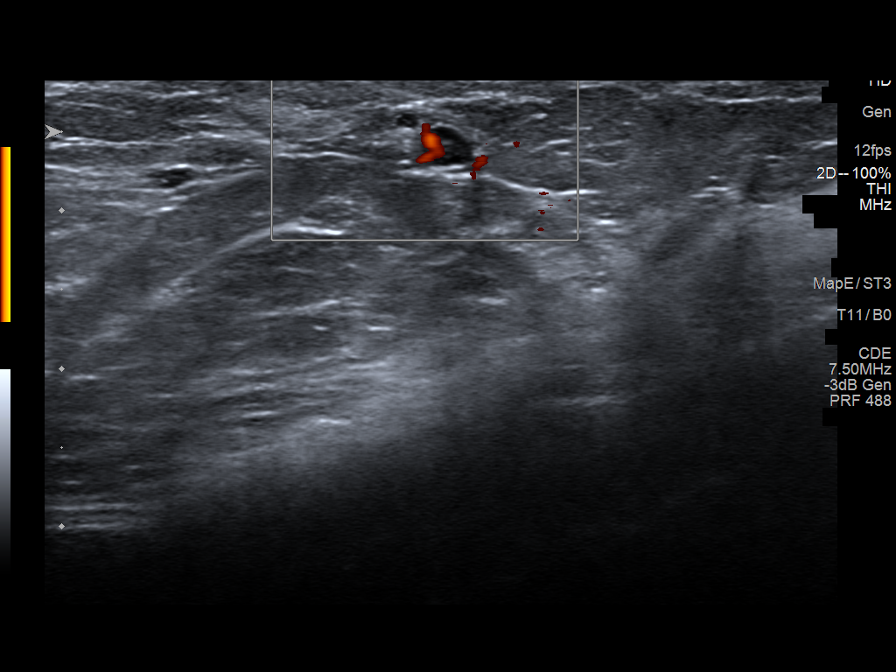

[9 of 9 positions shown; findings below may reference images not displayed]

ACR Breast Density Category b: There are scattered areas of
fibroglandular density.
FINDINGS: On today's additional diagnostic views with spot compression and 3D
tomosynthesis, an oval circumscribed low-density mass is confirmed
within the lower outer quadrant of the RIGHT breast, measuring
approximately 5 mm greatest dimension.

Targeted ultrasound is performed, showing a normal-appearing
intramammary lymph node within the RIGHT breast at the 7 o'clock
axis, 3 cm from the nipple, measuring 5 mm, corresponding to the
mammographic finding.
IMPRESSION: No evidence of malignancy. Normal-appearing intramammary lymph node
within the RIGHT breast at the 7 o'clock axis, measuring 5 mm,
corresponding to the mammographic finding.

Patient may return to routine annual bilateral screening mammogram
schedule.

RECOMMENDATION:
Screening mammogram in one year.(Code:ZE-8-BX3)

I have discussed the findings and recommendations with the patient.
If applicable, a reminder letter will be sent to the patient
regarding the next appointment.

BI-RADS CATEGORY  2: Benign.

## 2023-08-30 IMAGING — MG MM DIGITAL DIAGNOSTIC UNILAT*R* W/ TOMO W/ CAD
6 series · 6 of 18 positions shown · non-contrast
Comparison: Previous exam(s).

CLINICAL DATA: Patient returns today to evaluate a possible RIGHT
breast mass questioned on recent screening mammogram.

EXAM:
DIGITAL DIAGNOSTIC UNILATERAL RIGHT MAMMOGRAM WITH TOMOSYNTHESIS AND
CAD; ULTRASOUND RIGHT BREAST LIMITED
TECHNIQUE: Right digital diagnostic mammography and breast tomosynthesis was
performed. The images were evaluated with computer-aided detection.;
Targeted ultrasound examination of the right breast was performed

[R CC synth-2D (1 of 2)]
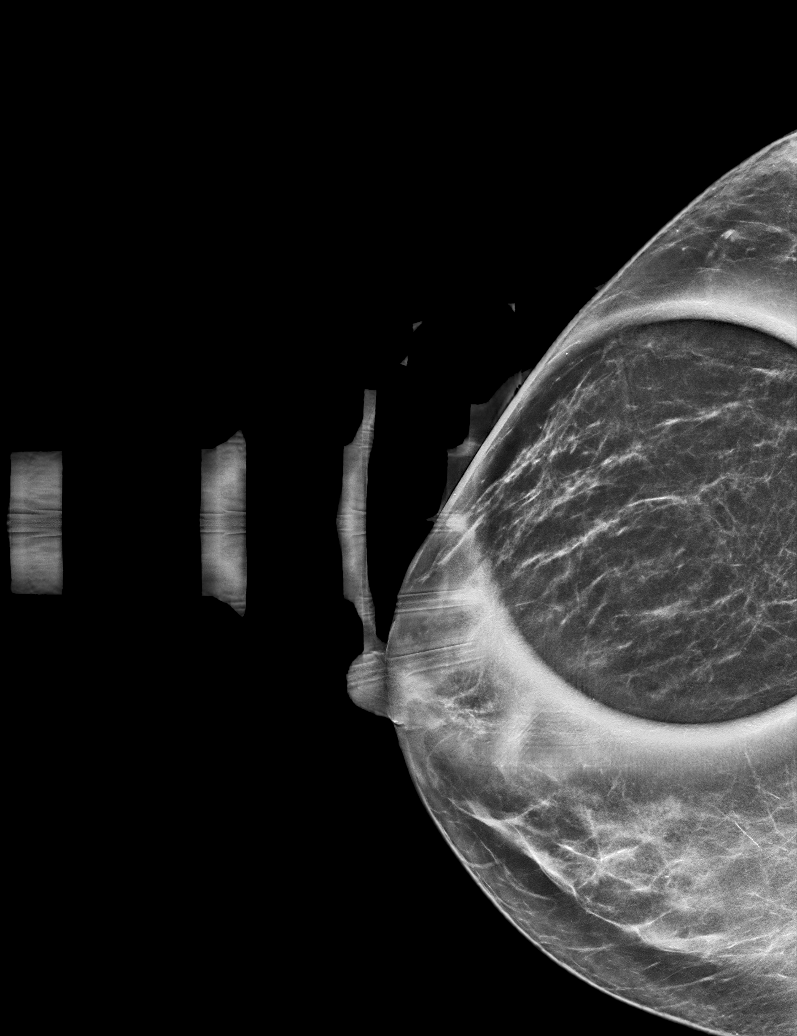

[R CC synth-2D (2 of 2)]
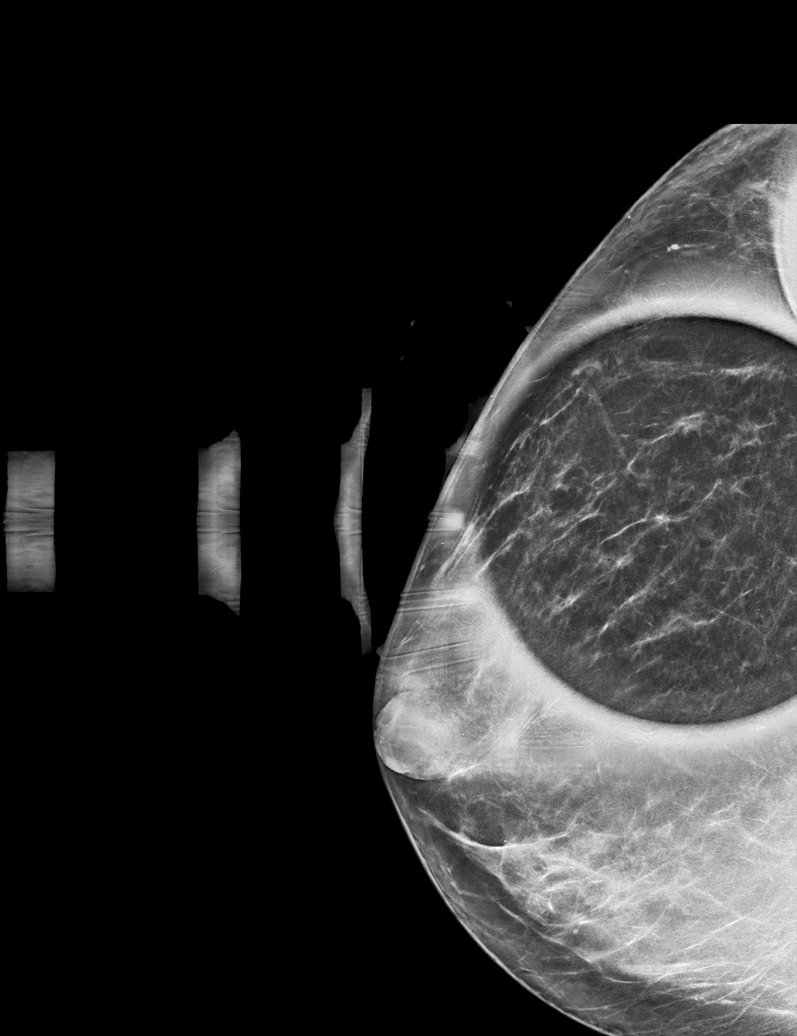

[R MLO synth-2D]
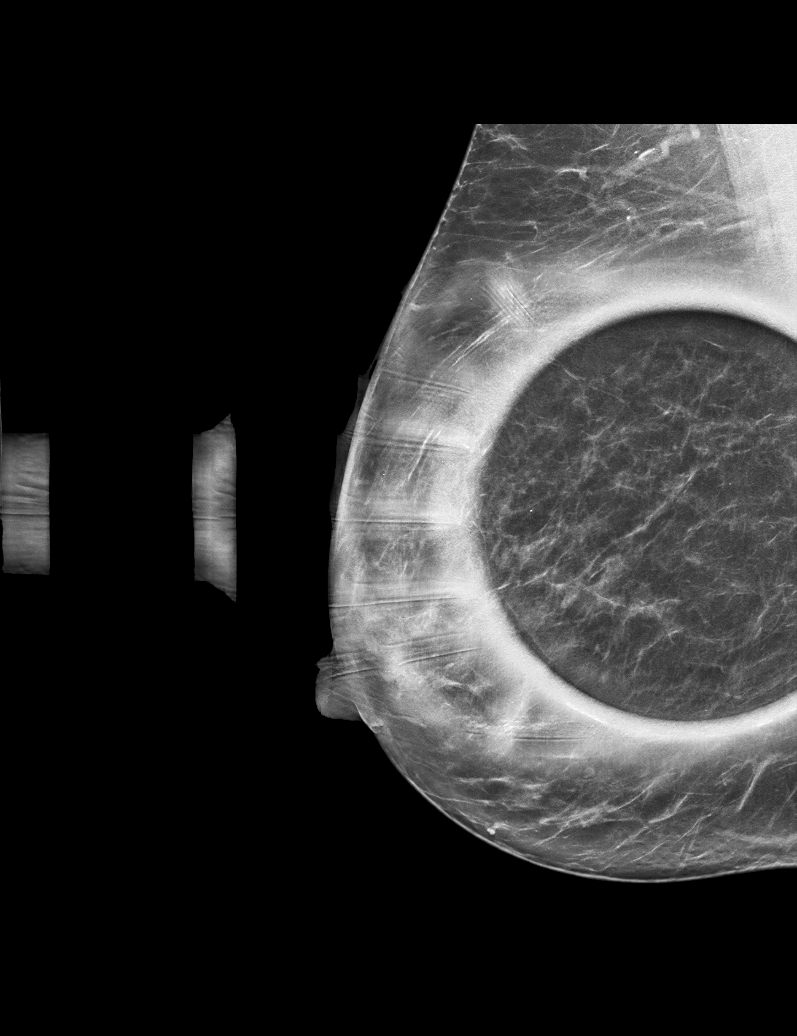

[R MLO tomo · tomo slice 33/66.0]
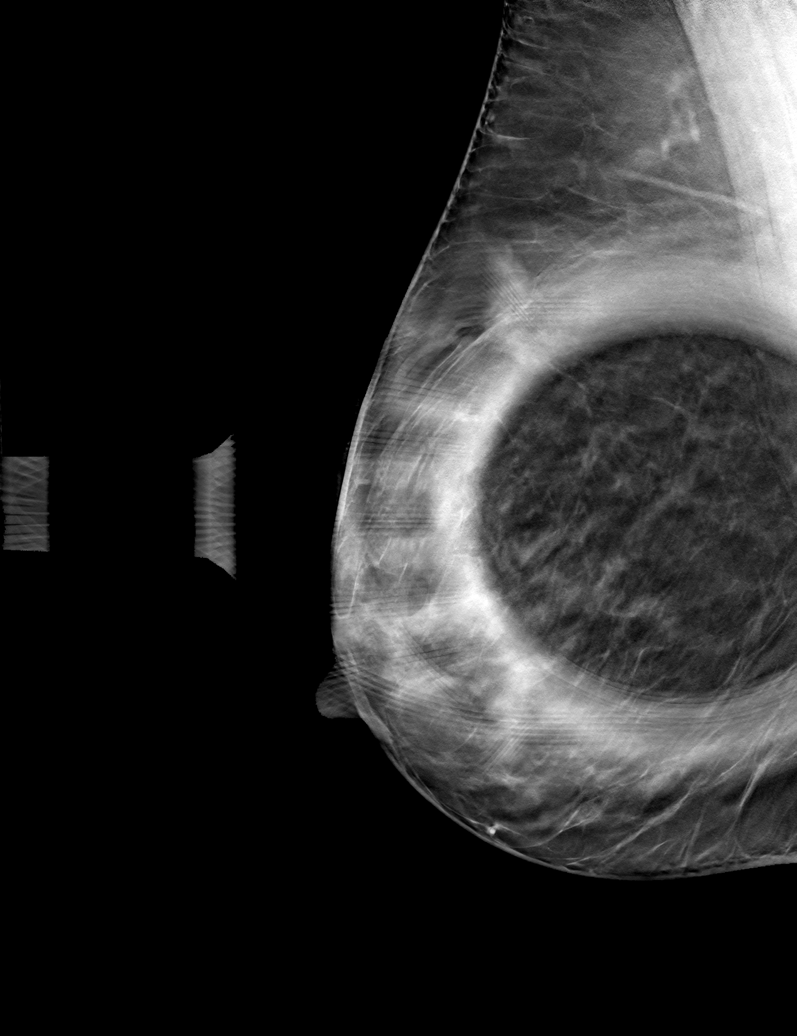

[R CC tomo (1 of 2) · tomo slice 30/59.0]
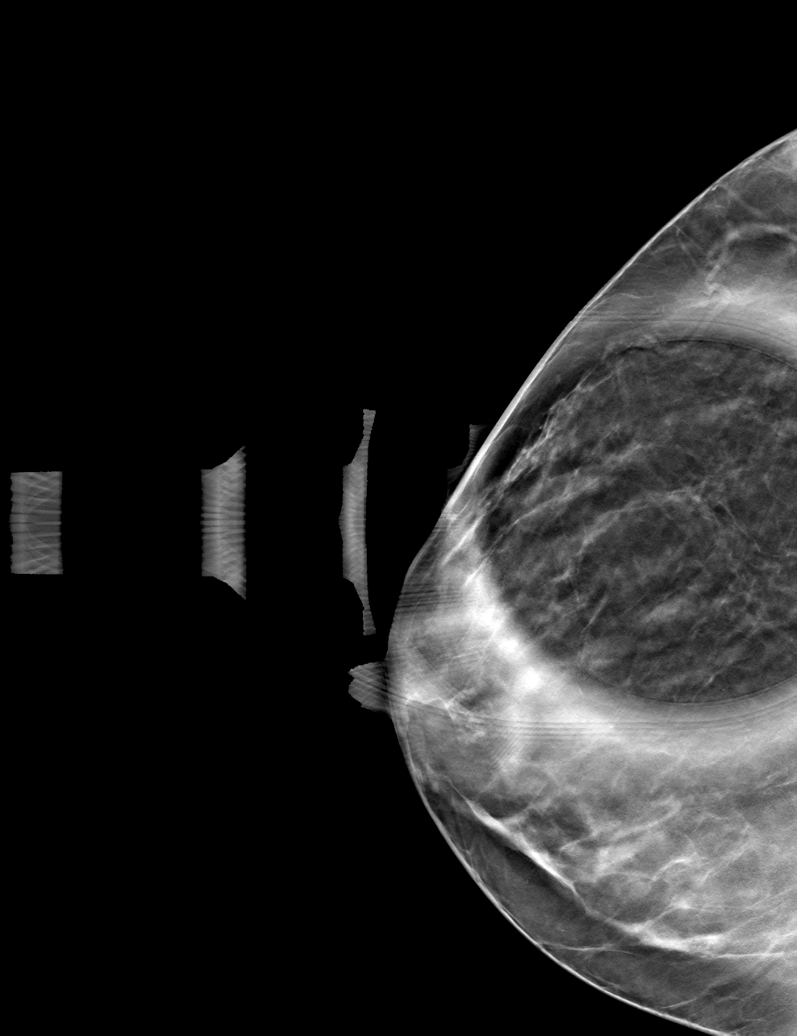

[R CC tomo (2 of 2) · tomo slice 32/63.0]
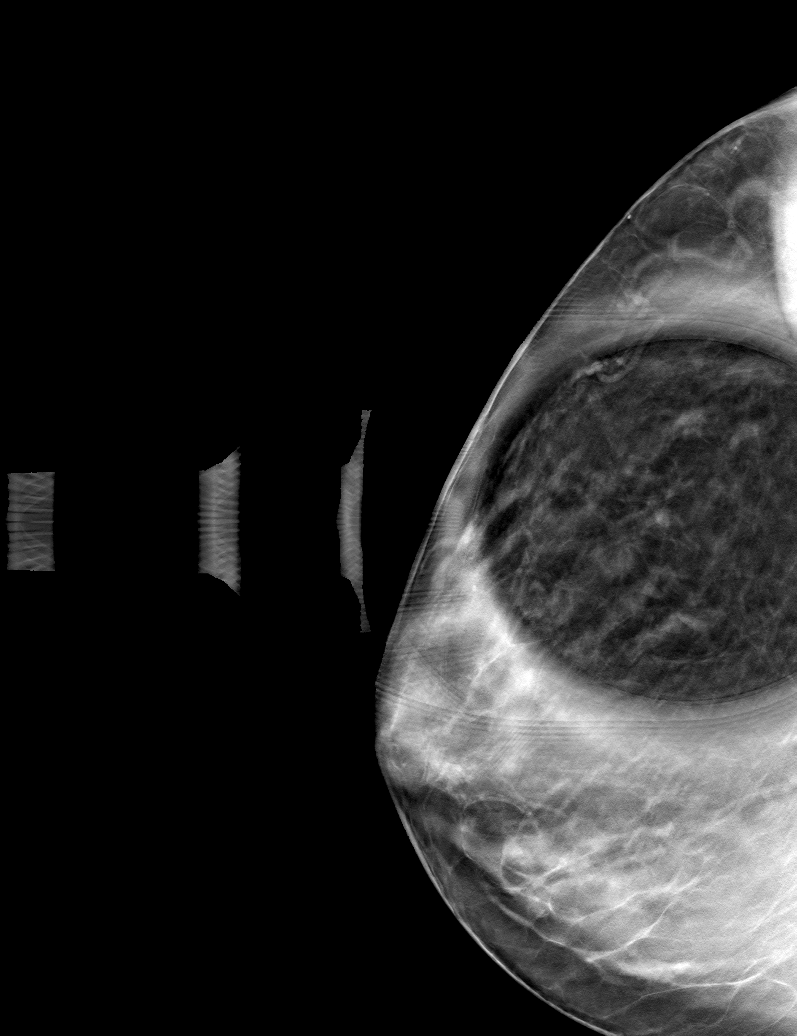

[6 of 18 positions shown; findings below may reference images not displayed]

ACR Breast Density Category b: There are scattered areas of
fibroglandular density.
FINDINGS: On today's additional diagnostic views with spot compression and 3D
tomosynthesis, an oval circumscribed low-density mass is confirmed
within the lower outer quadrant of the RIGHT breast, measuring
approximately 5 mm greatest dimension.

Targeted ultrasound is performed, showing a normal-appearing
intramammary lymph node within the RIGHT breast at the 7 o'clock
axis, 3 cm from the nipple, measuring 5 mm, corresponding to the
mammographic finding.
IMPRESSION: No evidence of malignancy. Normal-appearing intramammary lymph node
within the RIGHT breast at the 7 o'clock axis, measuring 5 mm,
corresponding to the mammographic finding.

Patient may return to routine annual bilateral screening mammogram
schedule.

RECOMMENDATION:
Screening mammogram in one year.(Code:ZE-8-BX3)

I have discussed the findings and recommendations with the patient.
If applicable, a reminder letter will be sent to the patient
regarding the next appointment.

BI-RADS CATEGORY  2: Benign.

## 2023-09-17 ENCOUNTER — Ambulatory Visit: Payer: 59 | Admitting: Rehabilitative and Restorative Service Providers"

## 2023-09-17 ENCOUNTER — Other Ambulatory Visit: Payer: Self-pay | Admitting: Urgent Care

## 2023-09-17 DIAGNOSIS — R635 Abnormal weight gain: Secondary | ICD-10-CM

## 2023-09-21 MED ORDER — PHENTERMINE HCL 15 MG PO CAPS: 15.0000 mg | ORAL_CAPSULE | ORAL | 0 refills | Status: DC

## 2023-09-30 ENCOUNTER — Encounter: Payer: Self-pay | Admitting: Urgent Care

## 2023-09-30 ENCOUNTER — Ambulatory Visit (INDEPENDENT_AMBULATORY_CARE_PROVIDER_SITE_OTHER): Payer: 59 | Admitting: Urgent Care

## 2023-09-30 VITALS — BP 117/79 | HR 65 | Wt 182.1 lb

## 2023-09-30 DIAGNOSIS — R635 Abnormal weight gain: Secondary | ICD-10-CM | POA: Diagnosis not present

## 2023-09-30 DIAGNOSIS — Z833 Family history of diabetes mellitus: Secondary | ICD-10-CM

## 2023-09-30 DIAGNOSIS — G44209 Tension-type headache, unspecified, not intractable: Secondary | ICD-10-CM

## 2023-09-30 DIAGNOSIS — F3289 Other specified depressive episodes: Secondary | ICD-10-CM | POA: Diagnosis not present

## 2023-09-30 MED ORDER — VENLAFAXINE HCL ER 75 MG PO CP24: 75.0000 mg | ORAL_CAPSULE | Freq: Every day | ORAL | 1 refills | Status: AC

## 2023-09-30 NOTE — Patient Instructions (Signed)
 Continue on the 75mg  Effexor. I am glad this is working well for you!!  Continue to use your Bernita Raisin as needed. Drink plenty of water throughout the day to prevent tension headaches.  Continue phentermine daily for weight loss. You can increase to 30mg  if desired - monitor for possible side effects including elevated blood pressure, heart rate or feeling jittery. If this occurs, go back to the 15mg  dose. If tolerated, continue with 30mg  with the possibility of raising to the max dose of 37.5mg    Return in July for your annual physical with fasting labs.

## 2023-09-30 NOTE — Progress Notes (Signed)
 Established Patient Office Visit  Subjective:  Patient ID: Caitlin Hunter, female    DOB: January 17, 1981  Age: 43 y.o. MRN: [id number]  Chief Complaint  Patient presents with   Follow-up    6 week follow up    HPI  The patient presents for follow-up regarding medication changes and weight management.  She is currently taking Effexor 75 mg, having transitioned from bupropion, and feels good overall with the change. She believes the medication has been beneficial and is satisfied with the recent adjustment. She has experienced a weight loss of a few Hunter, which she finds helpful.  She transitioned to Effexor partly to address her headaches or migraines. Since the last visit, she has experienced only one headache, for which she took [place]. She confirms having Ubrelvy at home for use as needed.  She started taking phentermine for weight management and initially felt 'a little buzzed', describing increased focus and alertness without negative effects. She is on the lowest dose of phentermine, 15 mg, and has recently refilled her prescription. She has lost four Hunter over the past five weeks, aligning with her weight loss goals.  Her blood pressure has been stable, with recent readings of 117/79 and 110/70, and she has not experienced any adverse effects from the phentermine.   No new concerns or complaints today.     Patient Active Problem List   Diagnosis Date Noted   Tension headache 08/19/2023   Weight gain 08/19/2023   Depression 08/19/2023   Family history of diabetes mellitus (DM) 08/19/2023   Pure hypercholesterolemia 05/20/2023   Right ovarian cyst 11/26/2016   Pregnancy 08/01/2013   Vaginal bleeding in pregnancy 08/01/2013   Past Medical History:  Diagnosis Date   Abnormal Pap smear    Allergy 1995   Asprin and seasonal   Depression 2022   Dysrhythmia    hx bradycardia   H/O varicella    Past Surgical History:  Procedure Laterality Date   ABDOMINAL  HYSTERECTOMY  2023   LEEP     Social History   Tobacco Use   Smoking status: Never   Smokeless tobacco: Never  Substance Use Topics   Alcohol use: Yes    Alcohol/week: 1.0 standard drink of alcohol    Types: 1 Glasses of wine per week   Drug use: No      ROS: as noted in HPI  Objective:     BP 117/79   Pulse 65   Wt 182 lb 1.9 oz (82.6 kg)   SpO2 98%   BMI 29.39 kg/m  BP Readings from Last 3 Encounters:  09/30/23 117/79  08/19/23 110/70  11/26/16 115/67   Wt Readings from Last 3 Encounters:  09/30/23 182 lb 1.9 oz (82.6 kg)  08/19/23 186 lb 6.4 oz (84.6 kg)  11/26/16 167 lb 9.6 oz (76 kg)      Physical Exam Vitals and nursing note reviewed.  Constitutional:      General: She is not in acute distress.    Appearance: Normal appearance. She is not ill-appearing, toxic-appearing or diaphoretic.  HENT:     Head: Normocephalic and atraumatic.  Eyes:     General: No scleral icterus.       Right eye: No discharge.        Left eye: No discharge.     Extraocular Movements: Extraocular movements intact.     Pupils: Pupils are equal, round, and reactive to light.  Cardiovascular:     Rate and Rhythm: Normal rate.  Pulmonary:     Effort: Pulmonary effort is normal. No respiratory distress.  Skin:    General: Skin is warm and dry.     Coloration: Skin is not jaundiced.     Findings: No bruising, erythema or rash.  Neurological:     General: No focal deficit present.     Mental Status: She is alert and oriented to person, place, and time.     Gait: Gait normal.  Psychiatric:        Mood and Affect: Mood normal.        Behavior: Behavior normal.      No results found for any visits on 09/30/23.  Last CBC Lab Results  Component Value Date   WBC 9.1 08/02/2013   HGB 11.8 (L) 08/02/2013   HCT 34.3 (L) 08/02/2013   MCV 87.7 08/02/2013   MCH 30.2 08/02/2013   RDW 12.8 08/02/2013   PLT 181 08/02/2013   Last metabolic panel No results found for:  "GLUCOSE", "NA", "K", "CL", "CO2", "BUN", "CREATININE", "EGFR", "CALCIUM", "PHOS", "PROT", "ALBUMIN", "LABGLOB", "AGRATIO", "BILITOT", "ALKPHOS", "AST", "ALT", "ANIONGAP"    The ASCVD Risk score (Arnett DK, et al., 2019) failed to calculate for the following reasons:   Cannot find a previous HDL lab   Cannot find a previous total cholesterol lab  Assessment & Plan:  Tension headache -     Venlafaxine HCl ER; Take 1 capsule (75 mg total) by mouth daily with breakfast.  Dispense: 90 capsule; Refill: 1  Other depression -     Venlafaxine HCl ER; Take 1 capsule (75 mg total) by mouth daily with breakfast.  Dispense: 90 capsule; Refill: 1  Weight gain  Family history of diabetes mellitus (DM)  Assessment and Plan    Major Depressive Disorder Transitioned to venlafaxine with positive response, currently on 75 mg, adjusting well. Venlafaxine chosen for potential migraine reduction. - Continue venlafaxine 75 mg daily. - Provide 90-day prescription for venlafaxine.  Migraine Significant reduction in frequency, managed with ubrogepant. Reduction may be due to venlafaxine. - Continue ubrogepant as needed for acute migraine management.  Weight Management Lost four Hunter over five weeks on phentermine, meeting weight loss goals. No significant side effects on blood pressure. Discussed potential dose increase. - Continue phentermine 15 mg daily. - Consider increasing dose to 30 mg if tolerated, monitor for side effects. - Monitor blood pressure regularly.  General Health Maintenance Last Pap smear in 2022, follows up with gynecologist. Recent blood work done, results to be faxed. Annual physical planned for July. - Schedule annual physical in July, including fasting blood work. - Ensure Pap smear results are faxed from gynecologist.         Return in about 4 months (around 01/25/2024) for Annual Physical.   Maretta Bees, PA

## 2023-10-12 ENCOUNTER — Encounter: Payer: Self-pay | Admitting: Urgent Care

## 2023-10-12 DIAGNOSIS — R635 Abnormal weight gain: Secondary | ICD-10-CM

## 2023-10-12 DIAGNOSIS — G44209 Tension-type headache, unspecified, not intractable: Secondary | ICD-10-CM

## 2023-10-12 MED ORDER — UBRELVY 100 MG PO TABS: 1.0000 | ORAL_TABLET | ORAL | 3 refills | Status: DC | PRN

## 2023-10-12 MED ORDER — PHENTERMINE HCL 37.5 MG PO TABS: 37.5000 mg | ORAL_TABLET | Freq: Every day | ORAL | 0 refills | Status: DC

## 2023-10-12 NOTE — Therapy (Unsigned)
 OUTPATIENT PHYSICAL THERAPY CERVICAL EVALUATION   Patient Name: Caitlin Hunter MRN: [id number] DOB:[birth year], 43 y.o., female Today's Date: 10/13/2023  END OF SESSION:  PT End of Session - 10/13/23 0808     Visit Number 1    Number of Visits 16    Date for PT Re-Evaluation 12/08/23    Authorization Type U[employer] copay $35    Authorization Time Period 23 visits/year    Authorization - Visit Number 1    Authorization - Number of Visits 23    PT Start Time 0805    PT Stop Time 0845    PT Time Calculation (min) 40 min    Activity Tolerance Patient tolerated treatment well             Past Medical History:  Diagnosis Date   Abnormal Pap smear    Allergy 1995   Asprin and seasonal   Depression 2022   Dysrhythmia    hx bradycardia   H/O varicella    Past Surgical History:  Procedure Laterality Date   ABDOMINAL HYSTERECTOMY  2023   LEEP     Patient Active Problem List   Diagnosis Date Noted   Tension headache 08/19/2023   Weight gain 08/19/2023   Depression 08/19/2023   Family history of diabetes mellitus (DM) 08/19/2023   Pure hypercholesterolemia 05/20/2023   Right ovarian cyst 11/26/2016   Pregnancy 08/01/2013   Vaginal bleeding in pregnancy 08/01/2013    PCP: Maretta Bees, PA  REFERRING PROVIDER: Maretta Bees, PA  REFERRING DIAG: Tension headache  THERAPY DIAG:  Tension headache  Other symptoms and signs involving the musculoskeletal system  Abnormal posture  Rationale for Evaluation and Treatment: Rehabilitation  ONSET DATE: 07/22/2019 intermittent symptoms; most recent 10/10/23  SUBJECTIVE:                                                                                                                                                                                                         SUBJECTIVE STATEMENT: Patient reports headaches over the past 4 years - at least 2x/mo lasting two days - treated with medication with improvement. No  history of migraines prior to 4 yrs ago  Hand dominance: Right  PERTINENT HISTORY:  Denies any medical problems; did have calcium deposit R shoulder ~ 10 years ago treatment with injection   PAIN:  Are you having pain? Yes: NPRS scale: 0/10 Pain location: headache; mostly R sided to eye   Pain description: pounding  Aggravating factors: wine; sugar; lack of sleep  Relieving factors: pressure at temple; meds  PRECAUTIONS: None  RED FLAGS: None     WEIGHT BEARING RESTRICTIONS: No  FALLS:  Has patient fallen in last 6 months? 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  DIAGNOSTIC FINDINGS:  None applicable to referral diagnosis   PATIENT SURVEYS:  Headache Disability Index 38; indicated moderate disability   COGNITION: Overall cognitive status: Within functional limits for tasks assessed  SENSATION: Sometimes R little finger will feel numb ~ 1 time/month - lasting a couple of days - for years   POSTURE: rounded shoulders, forward head, increased thoracic kyphosis, and flexed trunk   PALPATION: Muscular tightness R > L thoracic and cervical paraspinals; pecs; upper trap; leveator   CERVICAL ROM:   Active ROM A/PROM (deg) eval  Flexion 59  Extension 60  Right lateral flexion 37  Left lateral flexion 33  Right rotation 50  Left rotation 50   (Blank rows = not tested)  UPPER EXTREMITY ROM: WFL's - tight end range elevation R shoulder   Active ROM Right eval Left eval  Shoulder flexion    Shoulder extension    Shoulder abduction    Shoulder adduction    Shoulder extension    Shoulder  internal rotation    Shoulder external rotation    Elbow flexion    Elbow extension    Wrist flexion    Wrist extension    Wrist ulnar deviation    Wrist radial deviation    Wrist pronation    Wrist supination     (Blank rows = not tested)  UPPER EXTREMITY MMT: WFL's bilat UE's   MMT Right eval Left eval  Shoulder flexion    Shoulder extension    Shoulder abduction    Shoulder adduction    Shoulder extension    Shoulder internal rotation    Shoulder external rotation    Middle trapezius    Lower trapezius    Elbow flexion    Elbow extension    Wrist flexion    Wrist extension    Wrist ulnar deviation    Wrist radial deviation    Wrist pronation    Wrist supination    Grip strength     (Blank rows = not tested)  CERVICAL SPECIAL TESTS:  Upper limb tension test (ULTT): Positive, Spurling's test: Negative, and Distraction test: Negative  TREATMENT DATE: 10/13/23  Postural education and correction Ergonomic instruction and education for work desk/computer  Sitting posture using pool noodle  HEP  Myofacial ball release work standing  PATIENT EDUCATION:  Education details: POC; HEP  Person educated: Patient Education method: Programmer, multimedia, Demonstration, Actor cues, Verbal cues, and Handouts Education comprehension: verbalized understanding, returned demonstration, verbal cues required, tactile cues required, and needs further education  HOME EXERCISE PROGRAM: Access Code: [email or web address] URL: [email or web address] Date: 10/13/2023 Prepared by: Corlis Leak  Exercises - Seated Cervical Retraction  - 2 x daily - 7 x weekly - 1-2 sets - 5-10 reps - 10 sec  hold - Supine Cervical Retraction with Towel  - 2 x daily - 7 x weekly - 1 sets - 5-10 reps - 10 sec  hold - Seated Scapular Retraction  - 2 x daily - 7 x weekly - 1-2 sets - 10  reps - 10 sec  hold - Supine Scapular Retraction  - 2 x daily - 7 x weekly - 1 sets - 10 reps - 5-10 sec  hold - Doorway Pec Stretch at 60 Degrees Abduction  - 3 x daily - 7 x weekly - 1 sets - 3 reps - Doorway Pec Stretch at 90 Degrees Abduction  - 3 x daily - 7 x weekly - 1 sets - 3 reps - 30 seconds  hold - Doorway Pec Stretch at 120 Degrees Abduction  - 3 x daily - 7 x weekly - 1 sets - 3 reps - 30 second hold  hold - Standing Infraspinatus/Teres Minor Release with Ball at Wall  - 2 x daily - 7 x weekly - Standing Pectoral Release with Ball at Wall  - 3-4 x daily - 7 x weekly  Patient Education - Office Posture  ASSESSMENT:  CLINICAL IMPRESSION: Patient is a 43 y.o. female who was seen today for physical therapy evaluation and treatment for tension headache. Headaches are present on an intermittent basis lasting ~ 2 days and resolving with medication. Symptoms are primarily on R side of head radiating around to temple. She is R hand dominate. (Patient had a R shoulder pathology ~ 10 years ago requiring injection to help with calcium deposit.) she presents with poor cervical and thoracic posture and alignment; decreased cervical ROM/mobility; decreased end range shoulder ROM R > L; muscular tightness to palpation; radicular tingling R little finger on an intermittent basis. Patient will benefit from PT to address problems identified.   OBJECTIVE IMPAIRMENTS: decreased activity tolerance, decreased mobility, decreased ROM, decreased strength, increased fascial restrictions, increased muscle spasms, impaired flexibility, improper body mechanics, postural dysfunction, and pain.   ACTIVITY LIMITATIONS: carrying, lifting, reach over head, and desk and computer work   PARTICIPATION LIMITATIONS: shopping, occupation, and normal activities of daily living during times of headaches  PERSONAL FACTORS: Past/current experiences, Profession, Time since onset of injury/illness/exacerbation, and 1-2  comorbidities: chronic nature of problems; history of R shoulder pathology  are also affecting patient's functional outcome.   REHAB POTENTIAL: Good  CLINICAL DECISION MAKING: Evolving/moderate complexity  EVALUATION COMPLEXITY: Moderate   GOALS: Goals reviewed with patient? Yes  SHORT TERM GOALS: Target date: 11/10/2023   Independent in initial HEP  Baseline:  Goal status: INITIAL  2.  Improve position of scapulae along thoracic all with decreased pec tightness and improved postural strength  Baseline:  Goal status: INITIAL  3.  Decrease radicular tingling in R little finger by 75-100%  Baseline:  Goal status: INITIAL  LONG TERM GOALS: Target date: 12/08/2023   Improve posture and alignment with patient to demonstrate improve upright posture with patient to demonstrate engagement of posterior shoulder girdle musculature  Baseline:  Goal status: INITIAL  2.  Increase cervical ROM by 3-7 degrees with no pain or stiffness reported  Baseline:  Goal status: INITIAL  3.  Decrease frequency, intensity, duration of headaches by 75-90%  Baseline:  Goal status: INITIAL  4.  Patient to demonstrate and verbalize proper spine care and ergonomics for work and home  Baseline:  Goal status: INITIAL  5.  Independent in HEP  Baseline:  Goal status: INITIAL  6.  Improve headache disability index by 10 points to mild disability level  Baseline:  Goal status: INITIAL   PLAN:  PT FREQUENCY: 2x/week  PT DURATION: 8 weeks  PLANNED INTERVENTIONS: 97110-Therapeutic exercises, 97530- Therapeutic activity, 97112- Neuromuscular re-education, 97535- Self Care, 81191- Manual therapy, G0283- Electrical stimulation (unattended), 97035- Ultrasound, 47829- Traction (mechanical), [phone]- Ionotophoresis 4mg /ml Dexamethasone, Taping, Dry Needling, Joint mobilization, Cryotherapy, and Moist heat  PLAN FOR NEXT SESSION: review and progress with exercise; continue with spine care and body  mechanics education; manual work and modalities as indicated  Trial of manual work through cervical and thoracic spine and stretch for R shoulder end range; trial of neural mobilization    [employer], PT 10/13/2023, 10:58 AM

## 2023-10-13 ENCOUNTER — Encounter: Payer: Self-pay | Admitting: Rehabilitative and Restorative Service Providers"

## 2023-10-13 ENCOUNTER — Ambulatory Visit: Attending: Urgent Care | Admitting: Rehabilitative and Restorative Service Providers"

## 2023-10-13 ENCOUNTER — Other Ambulatory Visit: Payer: Self-pay

## 2023-10-13 DIAGNOSIS — R293 Abnormal posture: Secondary | ICD-10-CM | POA: Diagnosis present

## 2023-10-13 DIAGNOSIS — G44209 Tension-type headache, unspecified, not intractable: Secondary | ICD-10-CM | POA: Insufficient documentation

## 2023-10-13 DIAGNOSIS — R29898 Other symptoms and signs involving the musculoskeletal system: Secondary | ICD-10-CM | POA: Diagnosis present

## 2023-10-21 ENCOUNTER — Encounter: Payer: Self-pay | Admitting: Rehabilitative and Restorative Service Providers"

## 2023-10-21 ENCOUNTER — Ambulatory Visit: Attending: Urgent Care | Admitting: Rehabilitative and Restorative Service Providers"

## 2023-10-21 DIAGNOSIS — R29898 Other symptoms and signs involving the musculoskeletal system: Secondary | ICD-10-CM | POA: Diagnosis present

## 2023-10-21 DIAGNOSIS — G44209 Tension-type headache, unspecified, not intractable: Secondary | ICD-10-CM | POA: Insufficient documentation

## 2023-10-21 DIAGNOSIS — R293 Abnormal posture: Secondary | ICD-10-CM | POA: Diagnosis present

## 2023-10-21 NOTE — Therapy (Signed)
 OUTPATIENT PHYSICAL THERAPY CERVICAL TREATMENT    Patient Name: Caitlin Hunter MRN: [id number] DOB:10/16/1980, 43 y.o., female Today's Date: 10/21/2023  END OF SESSION:  PT End of Session - 10/21/23 0806     Visit Number 2    Number of Visits 16    Date for PT Re-Evaluation 12/08/23    Authorization Type U[employer] copay $35    Authorization Time Period 23 visits/year    Authorization - Visit Number 2    Authorization - Number of Visits 23    PT Start Time 0804    PT Stop Time 0845    PT Time Calculation (min) 41 min    Activity Tolerance Patient tolerated treatment well             Past Medical History:  Diagnosis Date   Abnormal Pap smear    Allergy 1995   Asprin and seasonal   Depression 2022   Dysrhythmia    hx bradycardia   H/O varicella    Past Surgical History:  Procedure Laterality Date   ABDOMINAL HYSTERECTOMY  2023   LEEP     Patient Active Problem List   Diagnosis Date Noted   Tension headache 08/19/2023   Weight gain 08/19/2023   Depression 08/19/2023   Family history of diabetes mellitus (DM) 08/19/2023   Pure hypercholesterolemia 05/20/2023   Right ovarian cyst 11/26/2016   Pregnancy 08/01/2013   Vaginal bleeding in pregnancy 08/01/2013    PCP: Maretta Bees, PA  REFERRING PROVIDER: Maretta Bees, PA  REFERRING DIAG: Tension headache  THERAPY DIAG:  Tension headache  Other symptoms and signs involving the musculoskeletal system  Abnormal posture  Rationale for Evaluation and Treatment: Rehabilitation  ONSET DATE: 07/22/2019 intermittent symptoms; most recent 10/10/23  SUBJECTIVE:                                                                                                                                                                                                         SUBJECTIVE STATEMENT: Patient reports continued symptoms - had a headache over the weekend. The neck and shoulder area feel sore from working on posture and  her exercises. She has made some ergonomic adjustments to work space.   EVAL: Patient reports headaches over the past 4 years - at least 2x/mo lasting two days - treated with medication with improvement. No history of migraines prior to 4 yrs ago  Hand dominance: Right  PERTINENT HISTORY:  Denies any medical problems; did have calcium deposit R shoulder ~ 10 years ago treatment with injection   PAIN:  Are you having pain? Yes: NPRS scale: 0/10 Pain location: headache; mostly R sided to eye   Pain description: pounding  Aggravating factors: wine; sugar; lack of sleep  Relieving factors: pressure at temple; meds  PRECAUTIONS: None   WEIGHT BEARING RESTRICTIONS: No  FALLS:  Has patient fallen in last 6 months? 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  DIAGNOSTIC FINDINGS:  None applicable to referral diagnosis   PATIENT SURVEYS:  Headache Disability Index 38; indicated moderate disability  SENSATION: Sometimes R little finger will feel numb ~ 1 time/month - lasting a couple of days present for years   POSTURE: rounded shoulders, forward head, increased thoracic kyphosis, and flexed trunk; scapulae abducted and rotated along the thoracic wall; head of the humerus anterior in orientation   PALPATION: Muscular tightness R > L thoracic and cervical paraspinals; pecs; upper trap; leveator   CERVICAL ROM:   Active ROM A/PROM (deg) eval  Flexion 59  Extension 60  Right lateral flexion 37  Left lateral flexion 33  Right rotation 50  Left rotation 50   (Blank rows = not tested)  UPPER EXTREMITY ROM: WFL's -  tight end range elevation R shoulder   Active ROM Right eval Left eval  Shoulder flexion    Shoulder extension    Shoulder abduction    Shoulder adduction    Shoulder extension    Shoulder internal rotation    Elbow flexion    Elbow extension    Wrist flexion    Wrist extension    Wrist ulnar deviation    Wrist radial deviation    Wrist pronation    Wrist supination     (Blank rows = not tested)  UPPER EXTREMITY MMT: WFL's bilat UE's   MMT Right eval Left eval  Shoulder flexion    Shoulder extension    Shoulder abduction    Shoulder adduction    Shoulder extension    Shoulder internal rotation    Shoulder external rotation    Middle trapezius    Lower trapezius    Elbow flexion    Elbow extension    Wrist flexion    Wrist extension    Wrist ulnar deviation    Wrist radial deviation    Wrist pronation    Wrist supination    Grip strength     (Blank rows = not tested)  CERVICAL SPECIAL TESTS:  Upper limb tension test (ULTT): Positive, Spurling's test: Negative, and Distraction test: Negative  [facility] Adult PT Treatment:                                                DATE: 10/21/23 Therapeutic Exercise: Supine  Chin tuck 5-10 sec x 5 Nodding yes to improve cervical mobility and decrease tightness through occipital area  Sitting Scap squeeze with noodle 5-10 sec x 5 Standing  Scap squeeze with noodle 5-10 sec x 5 Scap squeeze with noodle ER 2-3 sec x 10  W with noodle x 10  Backward shoulder rolls x  10-15  Manual Therapy: STM working through R > L ant/lat/posterior cervical musculature; pecs; upper trap patient supine LE's on bolster  PA mobs cervical and upper trap Grade II/III Myofacial release through anterior chest through anterior shoulders for gentle stretch through pecs - very tight and symptom provocation noted R  Manual cervical traction 20-30 min x 4-5 reps  Myofacial release through sternal area  Taping - kinesotaping inhibition of upper trap and  anterior shoulder instability bilat - patient educated re tape including wear time, removal and skin irritation and was provided handout on taping  Neuromuscular re-ed: Continued postural correction and education  Self Care: Myofacial ball release with 4 inch plastic ball    TREATMENT DATE: 10/13/23  Postural education and correction Ergonomic instruction and education for work desk/computer  Sitting posture using pool noodle  HEP  Myofacial ball release work standing                                                                                                                            PATIENT EDUCATION:  Education details: POC; HEP  Person educated: Patient Education method: Programmer, multimedia, Facilities manager, Actor cues, Verbal cues, and Handouts Education comprehension: verbalized understanding, returned demonstration, verbal cues required, tactile cues required, and needs further education  HOME EXERCISE PROGRAM: Access Code: [email or web address] URL: [email or web address] Date: 10/21/2023 Prepared by: Corlis Leak  Exercises - Seated Cervical Retraction  - 2 x daily - 7 x weekly - 1-2 sets - 5-10 reps - 10 sec  hold - Supine Cervical Retraction with Towel  - 2 x daily - 7 x weekly - 1 sets - 5-10 reps - 10 sec  hold - Seated Scapular Retraction  - 2 x daily - 7 x weekly - 1-2 sets - 10 reps - 10 sec  hold - Supine Scapular Retraction  - 2 x daily - 7 x weekly - 1 sets - 10 reps - 5-10 sec  hold - Doorway Pec Stretch at 60 Degrees Abduction  - 3 x daily - 7 x weekly - 1 sets - 3 reps - Doorway Pec Stretch at 90 Degrees Abduction  - 3 x daily - 7 x weekly - 1 sets - 3 reps - 30 seconds  hold - Doorway Pec Stretch at 120 Degrees Abduction  - 3 x daily - 7 x weekly - 1 sets - 3 reps - 30 second hold  hold - Standing Infraspinatus/Teres Minor Release with Ball at Wall  - 2 x daily - 7 x weekly - Standing Pectoral Release with Ball at Wall  - 3-4 x daily - 7 x weekly - Shoulder  External Rotation and Scapular Retraction with Resistance  - 2 x daily - 7 x weekly - 1 sets - 10 reps - 3-5 sec  hold - Shoulder W - External Rotation with Resistance  - 2 x daily - 7 x weekly - 1-2 sets - 10 reps - 3 sec  hold  Patient Education -  Office Posture - Taping  - Dry needling   ASSESSMENT:  CLINICAL IMPRESSION: Patient returns with increased soreness in R neck and shoulder area which she attributes to working on her posture and the exercises. She has had one headache over the weekend which may be due to activity at her daughter's track meet where she was racking wet sand. Treatment consisted of manual work through the cervical and R upper quarter. Notable tightness through pecs and upper trap; scaleni. Work through the pecs reproduces radicular symptoms in the R UE which return to baseline without a couple of minutes of changing focus of manual work. Trial of kineso taping to improve scapulothoracic posture. Patient would like to dry needling through pecs and scaleni which are the areas of greatest tightness and reactivity to manual work.    EVAL: Patient is a 43 y.o. female who was seen today for physical therapy evaluation and treatment for tension headache. Headaches are present on an intermittent basis lasting ~ 2 days and resolving with medication. Symptoms are primarily on R side of head radiating around to temple. She is R hand dominate. (Patient had a R shoulder pathology ~ 10 years ago requiring injection to help with calcium deposit.) she presents with poor cervical and thoracic posture and alignment; decreased cervical ROM/mobility; decreased end range shoulder ROM R > L; muscular tightness to palpation; radicular tingling R little finger on an intermittent basis. Patient will benefit from PT to address problems identified.   OBJECTIVE IMPAIRMENTS: decreased activity tolerance, decreased mobility, decreased ROM, decreased strength, increased fascial restrictions, increased  muscle spasms, impaired flexibility, improper body mechanics, postural dysfunction, and pain.   GOALS: Goals reviewed with patient? Yes  SHORT TERM GOALS: Target date: 11/10/2023   Independent in initial HEP  Baseline:  Goal status: INITIAL  2.  Improve position of scapulae along thoracic all with decreased pec tightness and improved postural strength  Baseline:  Goal status: INITIAL  3.  Decrease radicular tingling in R little finger by 75-100%  Baseline:  Goal status: INITIAL  LONG TERM GOALS: Target date: 12/08/2023   Improve posture and alignment with patient to demonstrate improve upright posture with patient to demonstrate engagement of posterior shoulder girdle musculature  Baseline:  Goal status: INITIAL  2.  Increase cervical ROM by 3-7 degrees with no pain or stiffness reported  Baseline:  Goal status: INITIAL  3.  Decrease frequency, intensity, duration of headaches by 75-90%  Baseline:  Goal status: INITIAL  4.  Patient to demonstrate and verbalize proper spine care and ergonomics for work and home  Baseline:  Goal status: INITIAL  5.  Independent in HEP  Baseline:  Goal status: INITIAL  6.  Improve headache disability index by 10 points to mild disability level  Baseline:  Goal status: INITIAL   PLAN:  PT FREQUENCY: 2x/week  PT DURATION: 8 weeks  PLANNED INTERVENTIONS: 97110-Therapeutic exercises, 97530- Therapeutic activity, 97112- Neuromuscular re-education, 97535- Self Care, 81191- Manual therapy, G0283- Electrical stimulation (unattended), 97035- Ultrasound, 47829- Traction (mechanical), [id number]- Ionotophoresis 4mg /ml Dexamethasone, Taping, Dry Needling, Joint mobilization, Cryotherapy, and Moist heat  PLAN FOR NEXT SESSION: review and progress with exercise; continue with spine care and body mechanics education; manual work and modalities as indicated  Continue manual work through cervical and thoracic spine and stretch for R shoulder end range;  trial of neural mobilization; schedule for DN   [employer], PT 10/21/2023, 8:55 PM

## 2023-10-27 MED ORDER — PHENTERMINE HCL 37.5 MG PO TABS: 37.5000 mg | ORAL_TABLET | Freq: Every day | ORAL | 0 refills | Status: DC

## 2023-10-27 MED ORDER — UBRELVY 100 MG PO TABS: ORAL_TABLET | ORAL | 5 refills | Status: DC

## 2023-10-27 NOTE — Telephone Encounter (Signed)
Nothing further needed at this time. 

## 2023-10-27 NOTE — Addendum Note (Signed)
 Addended by: Guy Sandifer on: 10/27/2023 01:28 PM   Modules accepted: Orders

## 2023-10-28 ENCOUNTER — Ambulatory Visit: Admitting: Rehabilitative and Restorative Service Providers"

## 2023-10-29 ENCOUNTER — Telehealth: Payer: Self-pay

## 2023-10-29 ENCOUNTER — Other Ambulatory Visit (HOSPITAL_COMMUNITY): Payer: Self-pay

## 2023-10-29 NOTE — Telephone Encounter (Signed)
 Pharmacy Patient Advocate Encounter   Received notification from CoverMyMeds that prior authorization for Ubrelvy 100MG  tablets is due for renewal.   Insurance verification completed.   The patient is insured through [facility].  Action: PA required; PA started via CoverMyMeds. KEY [id number] . Waiting for clinical questions to populate.

## 2023-10-29 NOTE — Telephone Encounter (Signed)
 RAN TEST CLAIM AND PT MUST HAVE  Step Therapy: Must try 2 generic triptans: (i.e. sumatriptan, naratriptan, etc.)    Does the patient have a history of a therapeutic failure (after at least 3 migraine episodes), contraindication or intolerance to TWO of the following? (1) almotriptan (Axert), (2) eletriptan (Relpax), (3) frovatriptan (Frova), (4) naratriptan (Amerge), (5) rizatriptan (Maxalt/Maxalt MLT), (6) sumatriptan (Imitrex), (7) zolmitriptan (Zomig/Zomig-ZMT).   IF PT HAS NOT TRIED AND FAIL 2 OF THE DRUGS LISTED ABOVE THE PA WILL BE DENIED. but REMEMBER in future YOU WILL need to code for Migraine, not Tension headache. FOR UBRELVY TO BE APPROVED

## 2023-10-30 ENCOUNTER — Other Ambulatory Visit: Payer: Self-pay

## 2023-10-30 ENCOUNTER — Encounter: Payer: Self-pay | Admitting: Urgent Care

## 2023-10-30 DIAGNOSIS — G44209 Tension-type headache, unspecified, not intractable: Secondary | ICD-10-CM

## 2023-10-30 DIAGNOSIS — G43809 Other migraine, not intractable, without status migrainosus: Secondary | ICD-10-CM

## 2023-11-02 MED ORDER — RIZATRIPTAN BENZOATE 10 MG PO TBDP: 10.0000 mg | ORAL_TABLET | ORAL | 3 refills | Status: AC | PRN

## 2023-11-02 NOTE — Telephone Encounter (Signed)
 Per pt note in January she has been having 4 a month. I did however just submit Rizatriptan for her as she states she never tried any of the Triptans. If you can't get it approved, an alternative has already been faxed in. Thanks

## 2023-11-02 NOTE — Telephone Encounter (Signed)
 Pharmacy Patient Advocate Encounter  Received notification from [facility] that Prior Authorization for Ubrelvy 100MG  tablets has been CANCELLED due to Provider is going to try pt on rizatriptan for now    PA #/Case ID/Reference #: [id number]

## 2023-11-02 NOTE — Telephone Encounter (Signed)
 Prior Authorization form/request asks a question that requires your assistance. Please see the question below and advise accordingly. The PA will not be submitted until the necessary information is received.    PLEASE BE ADVISED.Caitlin AasAaron Hunter

## 2023-11-03 NOTE — Therapy (Signed)
 OUTPATIENT PHYSICAL THERAPY CERVICAL TREATMENT    Patient Name: Caitlin Hunter MRN: [id number] DOB:16-Feb-1981, 43 y.o., female Today's Date: 11/04/2023  END OF SESSION:  PT End of Session - 11/04/23 0804     Visit Number 3    Number of Visits 16    Date for PT Re-Evaluation 12/08/23    Authorization Type [employer] copay $35    Authorization Time Period 23 visits/year    Authorization - Visit Number 3    Authorization - Number of Visits 23    PT Start Time 0804    PT Stop Time 0845    PT Time Calculation (min) 41 min    Activity Tolerance Patient tolerated treatment well    Behavior During Therapy WFL for tasks assessed/performed              Past Medical History:  Diagnosis Date   Abnormal Pap smear    Allergy 1995   Asprin and seasonal   Depression 2022   Dysrhythmia    hx bradycardia   H/O varicella    Past Surgical History:  Procedure Laterality Date   ABDOMINAL HYSTERECTOMY  2023   LEEP     Patient Active Problem List   Diagnosis Date Noted   Tension headache 08/19/2023   Weight gain 08/19/2023   Depression 08/19/2023   Family history of diabetes mellitus (DM) 08/19/2023   Pure hypercholesterolemia 05/20/2023   Right ovarian cyst 11/26/2016   Pregnancy 08/01/2013   Vaginal bleeding in pregnancy 08/01/2013    PCP: Mandy Second, PA  REFERRING PROVIDER: Mandy Second, PA  REFERRING DIAG: Tension headache  THERAPY DIAG:  Tension headache  Other symptoms and signs involving the musculoskeletal system  Abnormal posture  Rationale for Evaluation and Treatment: Rehabilitation  ONSET DATE: 07/22/2019 intermittent symptoms; most recent 10/10/23  SUBJECTIVE:                                                                                                                                                                                                         SUBJECTIVE STATEMENT: HA are improving. Last HA a week ago.  EVAL: Patient reports headaches  over the past 4 years - at least 2x/mo lasting two days - treated with medication with improvement. No history of migraines prior to 4 yrs ago  Hand dominance: Right  PERTINENT HISTORY:  Denies any medical problems; did have calcium deposit R shoulder ~ 10 years ago treatment with injection   PAIN:  Are you having pain? Yes: NPRS scale: 0/10 Pain location: headache; mostly R sided to  eye   Pain description: pounding  Aggravating factors: wine; sugar; lack of sleep  Relieving factors: pressure at temple; meds  PRECAUTIONS: None   WEIGHT BEARING RESTRICTIONS: No  FALLS:  Has patient fallen in last 6 months? 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  DIAGNOSTIC FINDINGS:  None applicable to referral diagnosis   PATIENT SURVEYS:  Headache Disability Index 38; indicated moderate disability  SENSATION: Sometimes R little finger will feel numb ~ 1 time/month - lasting a couple of days present for years   POSTURE: rounded shoulders, forward head, increased thoracic kyphosis, and flexed trunk; scapulae abducted and rotated along the thoracic wall; head of the humerus anterior in orientation   PALPATION: Muscular tightness R > L thoracic and cervical paraspinals; pecs; upper trap; leveator   CERVICAL ROM:   Active ROM A/PROM (deg) eval  Flexion 59  Extension 60  Right lateral flexion 37  Left lateral flexion 33  Right rotation 50  Left rotation 50   (Blank rows = not tested)  UPPER EXTREMITY ROM: WFL's - tight end range elevation R shoulder   Active ROM Right eval Left eval  Shoulder flexion    Shoulder  extension    Shoulder abduction    Shoulder adduction    Shoulder extension    Shoulder internal rotation    Elbow flexion    Elbow extension    Wrist flexion    Wrist extension    Wrist ulnar deviation    Wrist radial deviation    Wrist pronation    Wrist supination     (Blank rows = not tested)  UPPER EXTREMITY MMT: WFL's bilat UE's   MMT Right eval Left eval  Shoulder flexion    Shoulder extension    Shoulder abduction    Shoulder adduction    Shoulder extension    Shoulder internal rotation    Shoulder external rotation    Middle trapezius    Lower trapezius    Elbow flexion    Elbow extension    Wrist flexion    Wrist extension    Wrist ulnar deviation    Wrist radial deviation    Wrist pronation    Wrist supination    Grip strength     (Blank rows = not tested)  CERVICAL SPECIAL TESTS:  Upper limb tension test (ULTT): Positive, Spurling's test: Negative, and Distraction test: Negative  [facility] Adult PT Treatment:                                                DATE: 11/04/23 Therapeutic Exercise: Standing  Scap squeeze with noodle 5-10 sec x 5 Scap squeeze with noodle ER 2-3 sec x 10  W with noodle x 10  Chin tuck 5-10 sec x 5 Nodding yes to improve cervical mobility and decrease tightness through occipital area   Manual Therapy: Trigger Point Dry Needling  Initial Treatment: Pt instructed on Dry Needling rational, procedures, and possible side effects. Pt instructed to expect mild to moderate muscle soreness  later in the day and/or into the next day.  Pt instructed in methods to reduce muscle soreness. Pt instructed to continue prescribed HEP. Because Dry Needling was performed over or adjacent to a lung field, pt was educated on S/S of pneumothorax and to seek immediate medical attention should they occur.  Patient was educated on signs and symptoms of infection and other risk factors and advised to seek medical attention should they occur.  Patient  verbalized understanding of these instructions and education.   Patient Verbal Consent Given: Yes Education Handout Provided: Yes Muscles Treated: B UT, LS, SO and multifidi Electrical Stimulation Performed: No Treatment Response/Outcome: Utilized skilled palpation to identify bony landmarks and trigger points.  Able to illicit twitch response and muscle elongation.  Soft tissue mobilization to muscles needled  following DN to further promote tissue elongation and decreased pain.        [facility] Adult PT Treatment:                                                DATE: 10/21/23 Therapeutic Exercise: Supine  Chin tuck 5-10 sec x 5 Nodding yes to improve cervical mobility and decrease tightness through occipital area  Sitting Scap squeeze with noodle 5-10 sec x 5 Standing  Scap squeeze with noodle 5-10 sec x 5 Scap squeeze with noodle ER 2-3 sec x 10  W with noodle x 10  Backward shoulder rolls x 10-15  Manual Therapy: STM working through R > L ant/lat/posterior cervical musculature; pecs; upper trap patient supine LE's on bolster  PA mobs cervical and upper trap Grade II/III Myofacial release through anterior chest through anterior shoulders for gentle stretch through pecs - very tight and symptom provocation noted R  Manual cervical traction 20-30 min x 4-5 reps  Myofacial release through sternal area  Taping - kinesotaping inhibition of upper trap and anterior shoulder instability bilat - patient educated re tape including wear time, removal and skin irritation and was provided handout on taping  Neuromuscular re-ed: Continued postural correction and education  Self Care: Myofacial ball release with 4 inch plastic ball    TREATMENT DATE: 10/13/23  Postural education and correction Ergonomic instruction and education for work desk/computer  Sitting posture using pool noodle  HEP  Myofacial ball release work standing                                                                                                                             PATIENT EDUCATION:  Education details: DN education and aftercare Person educated: Patient Education method: Programmer, multimedia, Facilities manager, Actor cues, Verbal cues, and Handouts Education comprehension: verbalized understanding, returned demonstration, verbal cues required, tactile cues required, and needs further education  HOME EXERCISE PROGRAM: Access Code: [email or web address] URL: [email or web address] Date: 10/21/2023 Prepared by: Corlis Leak  Exercises - Seated Cervical Retraction  -  2 x daily - 7 x weekly - 1-2 sets - 5-10 reps - 10 sec  hold - Supine Cervical Retraction with Towel  - 2 x daily - 7 x weekly - 1 sets - 5-10 reps - 10 sec  hold - Seated Scapular Retraction  - 2 x daily - 7 x weekly - 1-2 sets - 10 reps - 10 sec  hold - Supine Scapular Retraction  - 2 x daily - 7 x weekly - 1 sets - 10 reps - 5-10 sec  hold - Doorway Pec Stretch at 60 Degrees Abduction  - 3 x daily - 7 x weekly - 1 sets - 3 reps - Doorway Pec Stretch at 90 Degrees Abduction  - 3 x daily - 7 x weekly - 1 sets - 3 reps - 30 seconds  hold - Doorway Pec Stretch at 120 Degrees Abduction  - 3 x daily - 7 x weekly - 1 sets - 3 reps - 30 second hold  hold - Standing Infraspinatus/Teres Minor Release with Ball at Wall  - 2 x daily - 7 x weekly - Standing Pectoral Release with Ball at Wall  - 3-4 x daily - 7 x weekly - Shoulder External Rotation and Scapular Retraction with Resistance  - 2 x daily - 7 x weekly - 1 sets - 10 reps - 3-5 sec  hold - Shoulder W - External Rotation with Resistance  - 2 x daily - 7 x weekly - 1-2 sets - 10 reps - 3 sec  hold  Patient Education - Office Posture - Taping  - Dry needling   ASSESSMENT:  CLINICAL IMPRESSION: Excellent response to initial trial of DN in posterior muscles today. She will benefit from more DN to her pectoralis muscles next visit. Patient reports she is improving overall and has not had a HA since  last week.   EVAL: Patient is a 43 y.o. female who was seen today for physical therapy evaluation and treatment for tension headache. Headaches are present on an intermittent basis lasting ~ 2 days and resolving with medication. Symptoms are primarily on R side of head radiating around to temple. She is R hand dominate. (Patient had a R shoulder pathology ~ 10 years ago requiring injection to help with calcium deposit.) she presents with poor cervical and thoracic posture and alignment; decreased cervical ROM/mobility; decreased end range shoulder ROM R > L; muscular tightness to palpation; radicular tingling R little finger on an intermittent basis. Patient will benefit from PT to address problems identified.   OBJECTIVE IMPAIRMENTS: decreased activity tolerance, decreased mobility, decreased ROM, decreased strength, increased fascial restrictions, increased muscle spasms, impaired flexibility, improper body mechanics, postural dysfunction, and pain.   GOALS: Goals reviewed with patient? Yes  SHORT TERM GOALS: Target date: 11/10/2023   Independent in initial HEP  Baseline:  Goal status: INITIAL  2.  Improve position of scapulae along thoracic all with decreased pec tightness and improved postural strength  Baseline:  Goal status: INITIAL  3.  Decrease radicular tingling in R little finger by 75-100%  Baseline:  Goal status: INITIAL  LONG TERM GOALS: Target date: 12/08/2023   Improve posture and alignment with patient to demonstrate improve upright posture with patient to demonstrate engagement of posterior shoulder girdle musculature  Baseline:  Goal status: INITIAL  2.  Increase cervical ROM by 3-7 degrees with no pain or stiffness reported  Baseline:  Goal status: INITIAL  3.  Decrease frequency, intensity, duration of headaches by 75-90%  Baseline:  Goal status: INITIAL  4.  Patient to demonstrate and verbalize proper spine care and ergonomics for work and home  Baseline:   Goal status: INITIAL  5.  Independent in HEP  Baseline:  Goal status: INITIAL  6.  Improve headache disability index by 10 points to mild disability level  Baseline:  Goal status: INITIAL   PLAN:  PT FREQUENCY: 2x/week  PT DURATION: 8 weeks  PLANNED INTERVENTIONS: 97110-Therapeutic exercises, 97530- Therapeutic activity, 97112- Neuromuscular re-education, 97535- Self Care, [id number]- Manual therapy, [id number]- Electrical stimulation (unattended), 97035- Ultrasound, [id number]- Traction (mechanical), [phone]- Ionotophoresis 4mg /ml Dexamethasone, Taping, Dry Needling, Joint mobilization, Cryotherapy, and Moist heat  PLAN FOR NEXT SESSION: review and progress with exercise; continue with spine care and body mechanics education; manual work and modalities as indicated  Continue manual work through cervical and thoracic spine and stretch for R shoulder end range; trial of neural mobilization; schedule for [employer], PT  11/04/2023, 4:36 PM

## 2023-11-04 ENCOUNTER — Encounter: Payer: Self-pay | Admitting: Physical Therapy

## 2023-11-04 ENCOUNTER — Ambulatory Visit: Admitting: Physical Therapy

## 2023-11-04 DIAGNOSIS — R29898 Other symptoms and signs involving the musculoskeletal system: Secondary | ICD-10-CM

## 2023-11-04 DIAGNOSIS — G44209 Tension-type headache, unspecified, not intractable: Secondary | ICD-10-CM

## 2023-11-04 DIAGNOSIS — R293 Abnormal posture: Secondary | ICD-10-CM

## 2023-11-04 NOTE — Patient Instructions (Signed)

## 2023-11-12 ENCOUNTER — Encounter: Payer: Self-pay | Admitting: Physical Therapy

## 2023-11-12 ENCOUNTER — Ambulatory Visit: Admitting: Physical Therapy

## 2023-11-12 DIAGNOSIS — G44209 Tension-type headache, unspecified, not intractable: Secondary | ICD-10-CM | POA: Diagnosis not present

## 2023-11-12 DIAGNOSIS — R29898 Other symptoms and signs involving the musculoskeletal system: Secondary | ICD-10-CM

## 2023-11-12 DIAGNOSIS — R293 Abnormal posture: Secondary | ICD-10-CM

## 2023-11-12 NOTE — Therapy (Signed)
 OUTPATIENT PHYSICAL THERAPY CERVICAL TREATMENT    Patient Name: Caitlin Hunter MRN: [id number] DOB:[birth year], 43 y.o., female Today's Date: 11/12/2023  END OF SESSION:  PT End of Session - 11/12/23 1019     Visit Number 4    Number of Visits 16    Date for PT Re-Evaluation 12/08/23    Authorization Type [employer] copay $[id number]    Authorization - Visit Number 4    Authorization - Number of Visits 23    PT Start Time 1018    PT Stop Time 1102    PT Time Calculation (min) 44 min    Activity Tolerance Patient tolerated treatment well    Behavior During Therapy WFL for tasks assessed/performed               Past Medical History:  Diagnosis Date   Abnormal Pap smear    Allergy 1995   Asprin and seasonal   Depression 2022   Dysrhythmia    hx bradycardia   H/O varicella    Past Surgical History:  Procedure Laterality Date   ABDOMINAL HYSTERECTOMY  2023   LEEP     Patient Active Problem List   Diagnosis Date Noted   Tension headache 08/19/2023   Weight gain 08/19/2023   Depression 08/19/2023   Family history of diabetes mellitus (DM) 08/19/2023   Pure hypercholesterolemia 05/20/2023   Right ovarian cyst 11/26/2016   Pregnancy 08/01/2013   Vaginal bleeding in pregnancy 08/01/2013    PCP: Mandy Second, PA  REFERRING PROVIDER: Mandy Second, PA  REFERRING DIAG: Tension headache  THERAPY DIAG:  Tension headache  Other symptoms and signs involving the musculoskeletal system  Abnormal posture  Rationale for Evaluation and Treatment: Rehabilitation  ONSET DATE: 07/22/2019 intermittent symptoms; most recent 10/10/23  SUBJECTIVE:                                                                                                                                                                                                         SUBJECTIVE STATEMENT: I feel like I have better ROM and less pain since last visit. I have not had a HA since   EVAL: Patient reports  headaches over the past 4 years - at least 2x/mo lasting two days - treated with medication with improvement. No history of migraines prior to 4 yrs ago  Hand dominance: Right  PERTINENT HISTORY:  Denies any medical problems; did have calcium deposit R shoulder ~ 10 years ago treatment with injection   PAIN:  Are you having pain? Yes: NPRS scale: 0/10 Pain  location: headache; mostly R sided to eye   Pain description: pounding  Aggravating factors: wine; sugar; lack of sleep  Relieving factors: pressure at temple; meds  PRECAUTIONS: None   WEIGHT BEARING RESTRICTIONS: No  FALLS:  Has patient fallen in last 6 months? 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  DIAGNOSTIC FINDINGS:  None applicable to referral diagnosis   PATIENT SURVEYS:  Headache Disability Index 38; indicated moderate disability  SENSATION: Sometimes R little finger will feel numb ~ 1 time/month - lasting a couple of days present for years   POSTURE: rounded shoulders, forward head, increased thoracic kyphosis, and flexed trunk; scapulae abducted and rotated along the thoracic wall; head of the humerus anterior in orientation   PALPATION: Muscular tightness R > L thoracic and cervical paraspinals; pecs; upper trap; leveator   CERVICAL ROM:   Active ROM A/PROM (deg) eval  Flexion 59  Extension 60  Right lateral flexion 37  Left lateral flexion 33  Right rotation 50  Left rotation 50   (Blank rows = not tested)  UPPER EXTREMITY ROM: WFL's - tight end range elevation R shoulder   Active ROM Right eval Left eval  Shoulder flexion     Shoulder extension    Shoulder abduction    Shoulder adduction    Shoulder extension    Shoulder internal rotation    Elbow flexion    Elbow extension    Wrist flexion    Wrist extension    Wrist ulnar deviation    Wrist radial deviation    Wrist pronation    Wrist supination     (Blank rows = not tested)  UPPER EXTREMITY MMT: WFL's bilat UE's   MMT Right eval Left eval  Shoulder flexion    Shoulder extension    Shoulder abduction    Shoulder adduction    Shoulder extension    Shoulder internal rotation    Shoulder external rotation    Middle trapezius    Lower trapezius    Elbow flexion    Elbow extension    Wrist flexion    Wrist extension    Wrist ulnar deviation    Wrist radial deviation    Wrist pronation    Wrist supination    Grip strength     (Blank rows = not tested)  CERVICAL SPECIAL TESTS:  Upper limb tension test (ULTT): Positive, Spurling's test: Negative, and Distraction test: Negative   [facility] Adult PT Treatment:                                                DATE: 11/12/23 Therapeutic Exercise: Standing  ER with red band x 10 Horizontal ABD red x 10 Rows green band x 20 Ext green band x 20 Chin tuck 5-10 sec x 10 into ball   Manual Therapy: Trigger Point Dry Needling  Subsequent Treatment: Instructions provided previously at initial dry needling treatment.   Patient Verbal Consent Given: Yes Education Handout Provided: Previously Provided Muscles Treated: B pecs, R UT,  LS, rhomboids, IS and B multifidi Electrical Stimulation Performed: No Treatment Response/Outcome: Utilized skilled palpation to identify bony landmarks and trigger points.  Able to illicit twitch response and muscle elongation.  Soft tissue mobilization to muscles needled following DN to further promote tissue elongation and decreased pain.         [facility] Adult PT Treatment:                                                DATE: 11/04/23 Therapeutic Exercise: Standing   Scap squeeze with noodle 5-10 sec x 5 Scap squeeze with noodle ER 2-3 sec x 10  W with noodle x 10  Chin tuck 5-10 sec x 5 Nodding yes to improve cervical mobility and decrease tightness through occipital area   Manual Therapy: Trigger Point Dry Needling  Initial Treatment: Pt instructed on Dry Needling rational, procedures, and possible side effects. Pt instructed to expect mild to moderate muscle soreness later in the day and/or into the next day.  Pt instructed in methods to reduce muscle soreness. Pt instructed to continue prescribed HEP. Because Dry Needling was performed over or adjacent to a lung field, pt was educated on S/S of pneumothorax and to seek immediate medical attention should they occur.  Patient was educated on signs and symptoms of infection and other risk factors and advised to seek medical attention should they occur.  Patient verbalized understanding of these instructions and education.   Patient Verbal Consent Given: Yes Education Handout Provided: Yes Muscles Treated: B UT, LS, SO and multifidi Electrical Stimulation Performed: No Treatment Response/Outcome: Utilized skilled palpation to identify bony landmarks and trigger points.  Able to illicit twitch response and muscle elongation.  Soft tissue mobilization to muscles needled  following DN to further promote tissue elongation and decreased pain.        [facility] Adult PT Treatment:                                                DATE: 10/21/23 Therapeutic Exercise: Supine  Chin tuck 5-10 sec x 5 Nodding yes to improve cervical mobility and decrease tightness through occipital area  Sitting Scap squeeze with noodle 5-10 sec x 5 Standing  Scap squeeze with noodle 5-10 sec x 5 Scap squeeze with noodle ER 2-3 sec x 10  W with noodle x 10  Backward shoulder rolls x 10-15  Manual Therapy: STM working through R > L ant/lat/posterior cervical musculature; pecs; upper trap patient supine LE's on bolster  PA mobs  cervical and upper trap Grade II/III Myofacial release through anterior chest through anterior shoulders for gentle stretch through pecs - very tight and symptom provocation noted R  Manual cervical traction 20-30 min x 4-5 reps  Myofacial release through sternal area  Taping - kinesotaping inhibition of upper trap and anterior shoulder instability bilat - patient educated re tape including wear time, removal and skin irritation and was provided handout on taping  Neuromuscular re-ed: Continued postural correction and education  Self Care: Myofacial ball release with 4 inch plastic ball    TREATMENT DATE: 10/13/23  Postural education and correction Ergonomic instruction and education for work desk/computer  Sitting posture using pool noodle  HEP  Myofacial  ball release work standing                                                                                                                            PATIENT EDUCATION:  Education details: DN education and aftercare Person educated: Patient Education method: Programmer, multimedia, Facilities manager, Actor cues, Verbal cues, and Handouts Education comprehension: verbalized understanding, returned demonstration, verbal cues required, tactile cues required, and needs further education  HOME EXERCISE PROGRAM: Access Code: [email or web address] URL: [email or web address] Date: 11/12/2023 Prepared by: Concha Deed  Exercises - Seated Cervical Retraction  - 2 x daily - 7 x weekly - 1-2 sets - 5-10 reps - 10 sec  hold - Supine Cervical Retraction with Towel  - 2 x daily - 7 x weekly - 1 sets - 5-10 reps - 10 sec  hold - Seated Scapular Retraction  - 2 x daily - 7 x weekly - 1-2 sets - 10 reps - 10 sec  hold - Supine Scapular Retraction  - 2 x daily - 7 x weekly - 1 sets - 10 reps - 5-10 sec  hold - Doorway Pec Stretch at 60 Degrees Abduction  - 3 x daily - 7 x weekly - 1 sets - 3 reps - Doorway Pec Stretch at 90 Degrees Abduction  - 3 x daily - 7 x  weekly - 1 sets - 3 reps - 30 seconds  hold - Doorway Pec Stretch at 120 Degrees Abduction  - 3 x daily - 7 x weekly - 1 sets - 3 reps - 30 second hold  hold - Standing Infraspinatus/Teres Minor Release with Ball at Wall  - 2 x daily - 7 x weekly - Standing Pectoral Release with Ball at Wall  - 3-4 x daily - 7 x weekly - Shoulder External Rotation and Scapular Retraction with Resistance  - 2 x daily - 7 x weekly - 1 sets - 10 reps - 3-5 sec  hold - Shoulder W - External Rotation with Resistance  - 2 x daily - 7 x weekly - 1-2 sets - 10 reps - 3 sec  hold - Standing Shoulder Horizontal Abduction with Resistance  - 1 x daily - 3 x weekly - 1-3 sets - 10 reps - Standing Shoulder Row with Anchored Resistance  - 1 x daily - 3 x weekly - 1-3 sets - 10 reps - Shoulder extension with resistance - Neutral  - 1 x daily - 3 x weekly - 1-3 sets - 10 reps  Patient Education - Office Posture  ASSESSMENT:  CLINICAL IMPRESSION: Patient reports no headaches since last visit. She is still having pain in bil chest and responded very well to DN in pecs R>L. Ongoing trigger points in post UQ B. Patient able to tolerate progression of HEP. Red and green bands issued for HEP. Pt continues to demonstrate potential for improvement and would benefit from continued skilled therapy to address impairments.     EVAL: Patient is a 43  y.o. female who was seen today for physical therapy evaluation and treatment for tension headache. Headaches are present on an intermittent basis lasting ~ 2 days and resolving with medication. Symptoms are primarily on R side of head radiating around to temple. She is R hand dominate. (Patient had a R shoulder pathology ~ 10 years ago requiring injection to help with calcium deposit.) she presents with poor cervical and thoracic posture and alignment; decreased cervical ROM/mobility; decreased end range shoulder ROM R > L; muscular tightness to palpation; radicular tingling R little finger on an  intermittent basis. Patient will benefit from PT to address problems identified.   OBJECTIVE IMPAIRMENTS: decreased activity tolerance, decreased mobility, decreased ROM, decreased strength, increased fascial restrictions, increased muscle spasms, impaired flexibility, improper body mechanics, postural dysfunction, and pain.   GOALS: Goals reviewed with patient? Yes  SHORT TERM GOALS: Target date: 11/10/2023   Independent in initial HEP  Baseline:  Goal status: MET 4/24  2.  Improve position of scapulae along thoracic all with decreased pec tightness and improved postural strength  Baseline:  Goal status: IN PROGRESS 4/24  3.  Decrease radicular tingling in R little finger by 75-100%  Baseline:  Goal status: IN PROGRESS 4/24  LONG TERM GOALS: Target date: 12/08/2023   Improve posture and alignment with patient to demonstrate improve upright posture with patient to demonstrate engagement of posterior shoulder girdle musculature  Baseline:  Goal status: INITIAL  2.  Increase cervical ROM by 3-7 degrees with no pain or stiffness reported  Baseline:  Goal status: INITIAL  3.  Decrease frequency, intensity, duration of headaches by 75-90%  Baseline:  Goal status: INITIAL  4.  Patient to demonstrate and verbalize proper spine care and ergonomics for work and home  Baseline:  Goal status: INITIAL  5.  Independent in HEP  Baseline:  Goal status: INITIAL  6.  Improve headache disability index by 10 points to mild disability level  Baseline:  Goal status: INITIAL   PLAN:  PT FREQUENCY: 2x/week  PT DURATION: 8 weeks  PLANNED INTERVENTIONS: 97110-Therapeutic exercises, 97530- Therapeutic activity, [id number]- Neuromuscular re-education, 97535- Self Care, 44034- Manual therapy, G0283- Electrical stimulation (unattended), 97035- Ultrasound, 74259- Traction (mechanical), [id number]- Ionotophoresis 4mg /ml Dexamethasone, Taping, Dry Needling, Joint mobilization, Cryotherapy, and Moist  heat  PLAN FOR NEXT SESSION: assess response to DN, how is tingling now?   Jinx Mourning, PT  11/12/2023, 12:12 PM

## 2023-11-19 ENCOUNTER — Ambulatory Visit: Admitting: Physical Therapy

## 2023-11-25 NOTE — Therapy (Addendum)
 OUTPATIENT PHYSICAL THERAPY CERVICAL TREATMENT  PHYSICAL THERAPY DISCHARGE SUMMARY  Visits from Start of Care: 5  Current functional level related to goals / functional outcomes: See progress note for discharge status    Remaining deficits: Unknown    Education / Equipment: HEP    Patient agrees to discharge. Patient goals were partially met. Patient is being discharged due to being pleased with the current functional level.  Caitlin Hunter PT, MPH 04/13/24 2:47 PM For Caitlin Hunter, PT    Patient Name: Caitlin Hunter MRN: [id number] DOB:1980-10-08, 43 y.o., female Today's Date: 11/26/2023  END OF SESSION:  PT End of Session - 11/26/23 0759     Visit Number 5    Number of Visits 16    Date for PT Re-Evaluation 12/08/23    Authorization Type [employer] copay $35    Authorization Time Period 23 visits/year    Authorization - Visit Number 5    Authorization - Number of Visits 23    PT Start Time 0800    PT Stop Time 0843    PT Time Calculation (min) 43 min    Activity Tolerance Patient tolerated treatment well    Behavior During Therapy WFL for tasks assessed/performed                Past Medical History:  Diagnosis Date   Abnormal Pap smear    Allergy 1995   Asprin and seasonal   Depression 2022   Dysrhythmia    hx bradycardia   H/O varicella    Past Surgical History:  Procedure Laterality Date   ABDOMINAL HYSTERECTOMY  2023   LEEP     Patient Active Problem List   Diagnosis Date Noted   Tension headache 08/19/2023   Weight gain 08/19/2023   Depression 08/19/2023   Family history of diabetes mellitus (DM) 08/19/2023   Pure hypercholesterolemia 05/20/2023   Right ovarian cyst 11/26/2016   Pregnancy 08/01/2013   Vaginal bleeding in pregnancy 08/01/2013    PCP: Caitlin LITTIE Gave, PA  REFERRING PROVIDER: Benton LITTIE Gave, PA  REFERRING DIAG: Tension headache  THERAPY DIAG:  Tension headache  Other symptoms and signs involving the  musculoskeletal system  Abnormal posture  Rationale for Evaluation and Treatment: Rehabilitation  ONSET DATE: 07/22/2019 intermittent symptoms; most recent 10/10/23  SUBJECTIVE:                                                                                                                                                                                                         SUBJECTIVE STATEMENT: Haven't had  any tingling. Have had a headache since last visit, but less pressure in neck.  EVAL: Patient reports headaches over the past 4 years - at least 2x/mo lasting two days - treated with medication with improvement. No history of migraines prior to 4 yrs ago  Hand dominance: Right  PERTINENT HISTORY:  Denies any medical problems; did have calcium deposit R shoulder ~ 10 years ago treatment with injection   PAIN:  Are you having pain? Yes: NPRS scale: 0/10 Pain location: headache; mostly R sided to eye   Pain description: pounding  Aggravating factors: wine; sugar; lack of sleep  Relieving factors: pressure at temple; meds  PRECAUTIONS: None   WEIGHT BEARING RESTRICTIONS: No  FALLS:  Has patient fallen in last 6 months? 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  DIAGNOSTIC FINDINGS:  None applicable to referral diagnosis   PATIENT SURVEYS:  Headache Disability Index 38; indicated moderate disability  SENSATION: Sometimes R little finger will feel numb ~ 1 time/month - lasting a couple of days present for years   POSTURE: rounded shoulders, forward head, increased thoracic kyphosis, and flexed trunk; scapulae  abducted and rotated along the thoracic wall; head of the humerus anterior in orientation   PALPATION: Muscular tightness R > L thoracic and cervical paraspinals; pecs; upper trap; leveator   CERVICAL ROM:   Active ROM A/PROM (deg) eval 11/26/23  Flexion 59   Extension 60   Right lateral flexion 37 32  Left lateral flexion 33 32  Right rotation 50   Left rotation 50    (Blank rows = not tested)  UPPER EXTREMITY ROM: WFL's - tight end range elevation R shoulder   Active ROM Right eval Left eval  Shoulder flexion    Shoulder extension    Shoulder abduction    Shoulder adduction    Shoulder extension    Shoulder internal rotation    Elbow flexion    Elbow extension    Wrist flexion    Wrist extension    Wrist ulnar deviation    Wrist radial deviation    Wrist pronation    Wrist supination     (Blank rows = not tested)  UPPER EXTREMITY MMT: WFL's bilat UE's   MMT Right eval Left eval  Shoulder flexion    Shoulder extension    Shoulder abduction    Shoulder adduction    Shoulder extension    Shoulder internal rotation    Shoulder external rotation    Middle trapezius    Lower trapezius    Elbow flexion    Elbow extension    Wrist flexion    Wrist extension    Wrist ulnar deviation    Wrist radial deviation    Wrist pronation    Wrist supination    Grip strength     (Blank rows = not tested)  CERVICAL SPECIAL TESTS:  Upper limb tension test (ULTT): Positive, Spurling's test: Negative, and Distraction test: Negative   [facility] Adult PT Treatment:  DATE: 11/26/23 NRE/Therapeutic Exercise: Standing  Low trap lift off facing wall x 10 Diagonals with red Tband - difficult (red and green attempted) Chin tuck 5-10 sec x 10 into ball  Seated   Lateral flexion and rotation 5 sec hold x 10 B  Prone elbows:   On elbows: Neck diagonals elbow to over opposite shoulder x 10 B   T and Y 2 x 10 ea   Manual  Therapy: Trigger Point Dry Needling  Subsequent Treatment: Instructions provided previously at initial dry needling treatment.   Patient Verbal Consent Given: Yes Education Handout Provided: Previously Provided Muscles Treated:  B UT, cervical multifidi and L SO Electrical Stimulation Performed: No Treatment Response/Outcome: Utilized skilled palpation to identify bony landmarks and trigger points.  Able to illicit twitch response and muscle elongation.  Soft tissue mobilization to muscles needled following DN to further promote tissue elongation and decreased pain.    [facility] Adult PT Treatment:                                                DATE: 11/12/23 Therapeutic Exercise: Standing  ER with red band x 10 Horizontal ABD red x 10 Rows green band x 20 Ext green band x 20 Chin tuck 5-10 sec x 10 into ball   Manual Therapy: Trigger Point Dry Needling  Subsequent Treatment: Instructions provided previously at initial dry needling treatment.   Patient Verbal Consent Given: Yes Education Handout Provided: Previously Provided Muscles Treated: B pecs, R UT, LS, rhomboids, IS and B multifidi Electrical Stimulation Performed: No Treatment Response/Outcome: Utilized skilled palpation to identify bony landmarks and trigger points.  Able to illicit twitch response and muscle elongation.  Soft tissue mobilization to muscles needled following DN to further promote tissue elongation and decreased pain.         [facility] Adult PT Treatment:                                                DATE: 11/04/23 Therapeutic Exercise: Standing  Scap squeeze with noodle 5-10 sec x 5 Scap squeeze with noodle ER 2-3 sec x 10  W with noodle x 10  Chin tuck 5-10 sec x 5 Nodding yes to improve cervical mobility and decrease tightness through occipital area   Manual Therapy: Trigger Point Dry Needling  Initial Treatment: Pt instructed on Dry Needling rational, procedures, and possible side effects. Pt  instructed to expect mild to moderate muscle soreness later in the day and/or into the next day.  Pt instructed in methods to reduce muscle soreness. Pt instructed to continue prescribed HEP. Because Dry Needling was performed over or adjacent to a lung field, pt was educated on S/S of pneumothorax and to seek immediate medical attention should they occur.  Patient was educated on signs and symptoms of infection and other risk factors and advised to seek medical attention should they occur.  Patient verbalized understanding of these instructions and education.   Patient Verbal Consent Given: Yes Education Handout Provided: Yes Muscles Treated: B UT, LS, SO and multifidi Electrical Stimulation Performed: No Treatment Response/Outcome: Utilized skilled palpation to identify bony landmarks and trigger points.  Able to illicit twitch response and muscle elongation.  Soft tissue  mobilization to muscles needled  following DN to further promote tissue elongation and decreased pain.        [facility] Adult PT Treatment:                                                DATE: 10/21/23 Therapeutic Exercise: Supine  Chin tuck 5-10 sec x 5 Nodding yes to improve cervical mobility and decrease tightness through occipital area  Sitting Scap squeeze with noodle 5-10 sec x 5 Standing  Scap squeeze with noodle 5-10 sec x 5 Scap squeeze with noodle ER 2-3 sec x 10  W with noodle x 10  Backward shoulder rolls x 10-15  Manual Therapy: STM working through R > L ant/lat/posterior cervical musculature; pecs; upper trap patient supine LE's on bolster  PA mobs cervical and upper trap Grade II/III Myofacial release through anterior chest through anterior shoulders for gentle stretch through pecs - very tight and symptom provocation noted R  Manual cervical traction 20-30 min x 4-5 reps  Myofacial release through sternal area  Taping - kinesotaping inhibition of upper trap and anterior shoulder instability bilat -  patient educated re tape including wear time, removal and skin irritation and was provided handout on taping  Neuromuscular re-ed: Continued postural correction and education  Self Care: Myofacial ball release with 4 inch plastic ball    TREATMENT DATE: 10/13/23  Postural education and correction Ergonomic instruction and education for work desk/computer  Sitting posture using pool noodle  HEP  Myofacial ball release work standing                                                                                                                            PATIENT EDUCATION:  Education details: DN education and aftercare Person educated: Patient Education method: Programmer, multimedia, Facilities manager, Actor cues, Verbal cues, and Handouts Education comprehension: verbalized understanding, returned demonstration, verbal cues required, tactile cues required, and needs further education  HOME EXERCISE PROGRAM: Access Code: [email or web address] URL: [email or web address] Date: 11/26/2023 Prepared by: Caitlin  Exercises - Seated Cervical Retraction  - 2 x daily - 7 x weekly - 1-2 sets - 5-10 reps - 10 sec  hold - Supine Cervical Retraction with Towel  - 2 x daily - 7 x weekly - 1 sets - 5-10 reps - 10 sec  hold - Seated Scapular Retraction  - 2 x daily - 7 x weekly - 1-2 sets - 10 reps - 10 sec  hold - Supine Scapular Retraction  - 2 x daily - 7 x weekly - 1 sets - 10 reps - 5-10 sec  hold - Doorway Pec Stretch at 60 Degrees Abduction  - 3 x daily - 7 x weekly - 1 sets - 3 reps - Doorway Pec Stretch at 90 Degrees Abduction  - 3 x daily - 7 x  weekly - 1 sets - 3 reps - 30 seconds  hold - Doorway Pec Stretch at 120 Degrees Abduction  - 3 x daily - 7 x weekly - 1 sets - 3 reps - 30 second hold  hold - Standing Infraspinatus/Teres Minor Release with Ball at Wall  - 2 x daily - 7 x weekly - Standing Pectoral Release with Ball at Wall  - 3-4 x daily - 7 x weekly - Shoulder External Rotation and Scapular  Retraction with Resistance  - 2 x daily - 7 x weekly - 1 sets - 10 reps - 3-5 sec  hold - Shoulder W - External Rotation with Resistance  - 2 x daily - 7 x weekly - 1-2 sets - 10 reps - 3 sec  hold - Standing Shoulder Horizontal Abduction with Resistance  - 1 x daily - 3 x weekly - 1-3 sets - 10 reps - Standing Shoulder Row with Anchored Resistance  - 1 x daily - 3 x weekly - 1-3 sets - 10 reps - Shoulder extension with resistance - Neutral  - 1 x daily - 3 x weekly - 1-3 sets - 10 reps - Prone Scapular Retraction Y  - 1 x daily - 3 x weekly - 2 sets - 10 reps - Prone Scapular Retraction Arms at Side  - 1 x daily - 3 x weekly - 2 sets - 10 reps  Patient Education - Office Posture  ASSESSMENT:  CLINICAL IMPRESSION: Patient presents today with reports of no tingling since last visit. She has had a headache, but reports less pressure and a little less intensity. She demonstrates excellent posture in the clinic. She has weakness in mid and low traps. HEP progressed with strengthening for these muscles today. Standing diagonals were too challenging with resistance so held these for now. Beth continues to demonstrate potential for improvement and would benefit from continued skilled therapy to address impairments.     EVAL: Patient is a 43 y.o. female who was seen today for physical therapy evaluation and treatment for tension headache. Headaches are present on an intermittent basis lasting ~ 2 days and resolving with medication. Symptoms are primarily on R side of head radiating around to temple. She is R hand dominate. (Patient had a R shoulder pathology ~ 10 years ago requiring injection to help with calcium deposit.) she presents with poor cervical and thoracic posture and alignment; decreased cervical ROM/mobility; decreased end range shoulder ROM R > L; muscular tightness to palpation; radicular tingling R little finger on an intermittent basis. Patient will benefit from PT to address problems  identified.   OBJECTIVE IMPAIRMENTS: decreased activity tolerance, decreased mobility, decreased ROM, decreased strength, increased fascial restrictions, increased muscle spasms, impaired flexibility, improper body mechanics, postural dysfunction, and pain.   GOALS: Goals reviewed with patient? Yes  SHORT TERM GOALS: Target date: 11/10/2023   Independent in initial HEP  Baseline:  Goal status: MET 4/24  2.  Improve position of scapulae along thoracic all with decreased pec tightness and improved postural strength  Baseline:  Goal status: IN PROGRESS 4/24  3.  Decrease radicular tingling in R little finger by 75-100%  Baseline:  Goal status: IN PROGRESS 4/24  LONG TERM GOALS: Target date: 12/08/2023   Improve posture and alignment with patient to demonstrate improve upright posture with patient to demonstrate engagement of posterior shoulder girdle musculature  Baseline:  Goal status: INITIAL  2.  Increase cervical ROM by 3-7 degrees with no pain or stiffness reported  Baseline:  Goal status: INITIAL  3.  Decrease frequency, intensity, duration of headaches by 75-90%  Baseline:  Goal status: INITIAL  4.  Patient to demonstrate and verbalize proper spine care and ergonomics for work and home  Baseline:  Goal status: INITIAL  5.  Independent in HEP  Baseline:  Goal status: INITIAL  6.  Improve headache disability index by 10 points to mild disability level  Baseline:  Goal status: INITIAL   PLAN:  PT FREQUENCY: 2x/week  PT DURATION: 8 weeks  PLANNED INTERVENTIONS: 97110-Therapeutic exercises, 97530- Therapeutic activity, [id number]- Neuromuscular re-education, 97535- Self Care, 02859- Manual therapy, G0283- Electrical stimulation (unattended), 97035- Ultrasound, 02987- Traction (mechanical), [id number]- Ionotophoresis 4mg /ml Dexamethasone, Taping, Dry Needling, Joint mobilization, Cryotherapy, and Moist heat  PLAN FOR NEXT SESSION:  assess response to DN, continue with Y  and T   Caitlin Hunter, PT  11/26/2023, 8:46 AM

## 2023-11-26 ENCOUNTER — Encounter: Payer: Self-pay | Admitting: Physical Therapy

## 2023-11-26 ENCOUNTER — Other Ambulatory Visit: Payer: Self-pay | Admitting: Urgent Care

## 2023-11-26 ENCOUNTER — Ambulatory Visit: Attending: Urgent Care | Admitting: Physical Therapy

## 2023-11-26 DIAGNOSIS — R29898 Other symptoms and signs involving the musculoskeletal system: Secondary | ICD-10-CM | POA: Insufficient documentation

## 2023-11-26 DIAGNOSIS — R293 Abnormal posture: Secondary | ICD-10-CM | POA: Diagnosis present

## 2023-11-26 DIAGNOSIS — G44209 Tension-type headache, unspecified, not intractable: Secondary | ICD-10-CM | POA: Insufficient documentation

## 2023-11-26 DIAGNOSIS — R635 Abnormal weight gain: Secondary | ICD-10-CM

## 2023-11-26 MED ORDER — PHENTERMINE HCL 37.5 MG PO TABS: 37.5000 mg | ORAL_TABLET | Freq: Every day | ORAL | 0 refills | Status: DC

## 2024-01-05 ENCOUNTER — Encounter: Payer: Self-pay | Admitting: Urgent Care

## 2024-01-05 DIAGNOSIS — R635 Abnormal weight gain: Secondary | ICD-10-CM

## 2024-01-05 MED ORDER — PHENTERMINE HCL 37.5 MG PO TABS: 37.5000 mg | ORAL_TABLET | Freq: Every day | ORAL | 0 refills | Status: DC

## 2024-01-11 ENCOUNTER — Encounter: Payer: Self-pay | Admitting: Urgent Care

## 2024-07-07 ENCOUNTER — Encounter: Payer: Self-pay | Admitting: Urgent Care

## 2024-07-07 ENCOUNTER — Ambulatory Visit: Admitting: Urgent Care

## 2024-07-07 VITALS — BP 103/67 | HR 56 | Ht 66.0 in | Wt 196.0 lb

## 2024-07-07 DIAGNOSIS — R635 Abnormal weight gain: Secondary | ICD-10-CM | POA: Diagnosis not present

## 2024-07-07 DIAGNOSIS — F329 Major depressive disorder, single episode, unspecified: Secondary | ICD-10-CM

## 2024-07-07 MED ORDER — BUPROPION HCL ER (XL) 150 MG PO TB24: 150.0000 mg | ORAL_TABLET | ORAL | 1 refills | Status: AC

## 2024-07-07 MED ORDER — PHENTERMINE HCL 37.5 MG PO TABS: 37.5000 mg | ORAL_TABLET | Freq: Every day | ORAL | 0 refills | Status: AC

## 2024-07-07 NOTE — Progress Notes (Signed)
 Established Patient Office Visit  Subjective:  Patient ID: Caitlin Hunter, female    DOB: 27-Jan-1981  Age: 44 y.o. MRN: [id number]  Chief Complaint  Patient presents with   Medical Management of Chronic Issues   Weight Check    HPI  Discussed the use of AI scribe software for clinical note transcription with the patient, who gave verbal consent to proceed.  History of Present Illness   Caitlin Hunter is a 43 year old female who presents for medication management and weight concerns.  She discontinued previously prescribed medications due to a lapse in insurance coverage after quitting her job in July. To avoid a gap in treatment, she resumed taking Wellbutrin  from a previous prescription. She is starting a new job in January and has obtained Medicaid to bridge the insurance gap.  She is experiencing difficulty with weight management. She reports exercising more and eating well. Weight gain is primarily in her midsection, which is unusual for her as she typically gains weight in her hips and thighs. She previously tried phentermine , which she felt worked well for the first couple of weeks, but then stopped being effective for her. She lost about four pounds over six weeks when she last used it.  Her mood symptoms are currently well-controlled with Wellbutrin , and she does not notice a significant difference compared to when she was on venlafaxine . She is exercising and her sleep is described as 'great'.  She had a hysterectomy four years ago and reports symptoms that she wonders may be related to perimenopause, but she is unsure. Her mother died of ovarian cancer at age 76, and she has one ovary remaining. She undergoes Pap smears every two years and has not had routine ultrasounds or CEA levels for ovarian cancer screening.  She exercises at home, primarily running and doing body weight exercises. She experiences joint inflammation and soreness when lifting weights, which  discourages her from weightlifting. She is concerned about gaining weight in her new remote job role, as she gained weight the last time she worked from home.       Patient Active Problem List   Diagnosis Date Noted   Tension headache 08/19/2023   Weight gain 08/19/2023   Depression 08/19/2023   Family history of diabetes mellitus (DM) 08/19/2023   Pure hypercholesterolemia 05/20/2023   Right ovarian cyst 11/26/2016   Pregnancy 08/01/2013   Vaginal bleeding in pregnancy 08/01/2013   Past Medical History:  Diagnosis Date   Abnormal Pap smear    Allergy 1995   Asprin and seasonal   Depression 2022   Dysrhythmia    hx bradycardia   H/O varicella    Past Surgical History:  Procedure Laterality Date   ABDOMINAL HYSTERECTOMY  2023   LEEP     Social History[1]    ROS: as noted in HPI  Objective:     BP 103/67   Pulse (!) 56   Ht 5' 6 (1.676 m)   Wt 196 lb (88.9 kg)   SpO2 98%   BMI 31.64 kg/m  BP Readings from Last 3 Encounters:  07/07/24 103/67  09/30/23 117/79  08/19/23 110/70   Wt Readings from Last 3 Encounters:  07/07/24 196 lb (88.9 kg)  09/30/23 182 lb 1.9 oz (82.6 kg)  08/19/23 186 lb 6.4 oz (84.6 kg)      Physical Exam Vitals and nursing note reviewed.  Constitutional:      General: She is not in acute distress.    Appearance: Normal  appearance. She is not ill-appearing, toxic-appearing or diaphoretic.  HENT:     Head: Normocephalic and atraumatic.     Right Ear: Tympanic membrane, ear canal and external ear normal. There is no impacted cerumen.     Left Ear: Tympanic membrane, ear canal and external ear normal. There is no impacted cerumen.     Nose: Nose normal. No congestion or rhinorrhea.     Mouth/Throat:     Mouth: Mucous membranes are moist.     Pharynx: Oropharynx is clear. No oropharyngeal exudate or posterior oropharyngeal erythema.  Eyes:     General: No scleral icterus.       Right eye: No discharge.        Left eye: No  discharge.     Extraocular Movements: Extraocular movements intact.     Pupils: Pupils are equal, round, and reactive to light.  Cardiovascular:     Rate and Rhythm: Normal rate and regular rhythm.  Pulmonary:     Effort: Pulmonary effort is normal. No respiratory distress.     Breath sounds: Normal breath sounds. No stridor.  Musculoskeletal:     Cervical back: Normal range of motion and neck supple. No rigidity or tenderness.  Lymphadenopathy:     Cervical: No cervical adenopathy.  Skin:    General: Skin is warm and dry.     Coloration: Skin is not jaundiced.     Findings: No bruising, erythema or rash.  Neurological:     General: No focal deficit present.     Mental Status: She is alert and oriented to person, place, and time.     Gait: Gait normal.  Psychiatric:        Mood and Affect: Mood normal.        Behavior: Behavior normal.      No results found for any visits on 07/07/24.  Last CBC Lab Results  Component Value Date   WBC 9.1 08/02/2013   HGB 11.8 (L) 08/02/2013   HCT 34.3 (L) 08/02/2013   MCV 87.7 08/02/2013   MCH 30.2 08/02/2013   RDW 12.8 08/02/2013   PLT 181 08/02/2013      07/07/2024    8:56 AM 08/19/2023    8:32 AM  GAD 7 : Generalized Anxiety Score  Nervous, Anxious, on Edge 1 1  Control/stop worrying 0 0  Worry too much - different things 0 0  Trouble relaxing 1 1  Restless 1 1  Easily annoyed or irritable 1 1  Afraid - awful might happen 1 1  Total GAD 7 Score 5 5  Anxiety Difficulty Somewhat difficult Somewhat difficult       07/07/2024    8:56 AM 08/19/2023    8:32 AM  Depression screen PHQ 2/9  Decreased Interest 0 0  Down, Depressed, Hopeless 0 0  PHQ - 2 Score 0 0  Altered sleeping 0 1  Tired, decreased energy 1 2  Change in appetite 1 2  Feeling bad or failure about yourself  1 2  Trouble concentrating 0 1  Moving slowly or fidgety/restless 0 0  Suicidal thoughts 0 0  PHQ-9 Score 3 8   Difficult doing work/chores  Somewhat difficult Somewhat difficult     Data saved with a previous flowsheet row definition     The ASCVD Risk score (Arnett DK, et al., 2019) failed to calculate for the following reasons:   Cannot find a previous HDL lab   Cannot find a previous total cholesterol lab   * -  Cholesterol units were assumed  Assessment & Plan:  Reactive depression -     buPROPion  HCl ER (XL); Take 1 tablet (150 mg total) by mouth every morning.  Dispense: 90 tablet; Refill: 1  Weight gain -     Phentermine  HCl; Take 1 tablet (37.5 mg total) by mouth daily before breakfast.  Dispense: 90 tablet; Refill: 0  Assessment and Plan    Obesity Difficulty losing weight despite lifestyle changes. Previous phentermine  use led to initial weight loss but plateaued. Discussed weight management options including phentermine , Vyvanse, and GLP-1 receptor agonists. GLP-1 receptor agonists are costly with insurance criteria for coverage. Emphasized muscle mass increase through weightlifting to boost metabolism. - Consider restarting phentermine  since previously tolerated. - Encouraged weightlifting with lighter weights and higher repetitions. - Recommended exploring online workout programs like [employer]odi by [employer].  Major depressive disorder Managed with bupropion  150 mg daily. Previous venlafaxine  switch ineffective. Symptoms well-controlled, no significant anhedonia or sleep disturbances. Reports overall improvement and engagement in enjoyable activities. - Continue bupropion  150 mg once daily.  General health maintenance Discussed recent blood work and Pap smear history. Pap smears continue post-hysterectomy due to family history of ovarian cancer. No routine imaging or CA-125 levels performed. Discussed potential ovarian cancer screening options. - Discuss ovarian cancer screening options with gynecologist, including routine ultrasounds or blood tests. - Consider updating blood work at next visit.          Return in about 2 months (around 09/07/2024).   Benton LITTIE Gave, PA     [1]  Social History Tobacco Use   Smoking status: Never   Smokeless tobacco: Never  Substance Use Topics   Alcohol use: Yes    Alcohol/week: 1.0 standard drink of alcohol    Types: 1 Glasses of wine per week   Drug use: No

## 2024-07-07 NOTE — Patient Instructions (Signed)
 Re-start phentermine . Take once daily in the morning with breakfast. Monitor weight response and blood pressure.  Look up [employer] by [employer]. These are online workout programs that can help. Try to tone the muscles with low weights but lots of reps. This will naturally increase your metabolism.  Regarding ovarian cancer history - please talk with your gynecologist about imaging/ labs for screening purposes.  Lets follow up in the new year once you get your new insurance.

## 2024-09-07 ENCOUNTER — Ambulatory Visit: Admitting: Urgent Care
